# Patient Record
Sex: Female | Born: 1995 | Hispanic: Yes | Marital: Single | Smoking: Never smoker
Health system: Southern US, Community
[De-identification: ages and names within clinical notes are randomized; demographics above are authoritative.]

---
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
## 2014-03-29 NOTE — L&D Delivery Note (Cosign Needed)
    Delivery Note At 8:46 AM a viable and healthy female was delivered via Vaginal, Spontaneous Delivery in partial caul. (Presentation: Right Occiput Anterior, compound presentation with left hand by face).  APGAR:8 ,9 ; weight pending  .   Placenta status: Intact, Spontaneous.  Cord:  with the following complications: loose cord around body, easily reduced.  Cord pH: N/A  Anesthesia: None  Episiotomy: None Lacerations: None Suture Repair: none Est. Blood Loss (mL):  50  Mom to postpartum.  Baby to Couplet care / Skin to Skin.  De Hollingshead 10/23/2014, 9:09 AM   Patient is a G2P2001 at [redacted]w[redacted]d who was admitted active labor, significant hx of uncomplicated prenatal course.    I was gloved and present for delivery in its entirety.  Second stage of labor progressed, baby delivered easily through body cord without anesthesia. No complications  Lacerations: none  EBL: 50  Federico Flake, MD 8:13 AM

## 2014-05-02 ENCOUNTER — Other Ambulatory Visit (HOSPITAL_COMMUNITY): Payer: Self-pay | Admitting: Physician Assistant

## 2014-05-02 DIAGNOSIS — Z3689 Encounter for other specified antenatal screening: Secondary | ICD-10-CM

## 2014-05-02 LAB — OB RESULTS CONSOLE RPR: RPR: NONREACTIVE

## 2014-05-02 LAB — OB RESULTS CONSOLE ABO/RH: RH Type: POSITIVE

## 2014-05-02 LAB — OB RESULTS CONSOLE ANTIBODY SCREEN: Antibody Screen: NEGATIVE

## 2014-05-02 LAB — OB RESULTS CONSOLE HIV ANTIBODY (ROUTINE TESTING): HIV: NONREACTIVE

## 2014-05-02 LAB — OB RESULTS CONSOLE RUBELLA ANTIBODY, IGM: Rubella: IMMUNE

## 2014-05-02 LAB — OB RESULTS CONSOLE HEPATITIS B SURFACE ANTIGEN: Hepatitis B Surface Ag: NEGATIVE

## 2014-06-06 ENCOUNTER — Encounter (HOSPITAL_COMMUNITY): Payer: Self-pay

## 2014-06-06 ENCOUNTER — Ambulatory Visit (HOSPITAL_COMMUNITY)
Admission: RE | Admit: 2014-06-06 | Discharge: 2014-06-06 | Disposition: A | Discharge status: Home / Self Care | Payer: Self-pay | Source: Ambulatory Visit | Attending: Physician Assistant | Admitting: Physician Assistant

## 2014-06-06 DIAGNOSIS — Z36 Encounter for antenatal screening of mother: Secondary | ICD-10-CM | POA: Insufficient documentation

## 2014-06-06 DIAGNOSIS — Z3689 Encounter for other specified antenatal screening: Secondary | ICD-10-CM | POA: Insufficient documentation

## 2014-06-06 DIAGNOSIS — Z3A19 19 weeks gestation of pregnancy: Secondary | ICD-10-CM | POA: Insufficient documentation

## 2014-06-19 ENCOUNTER — Ambulatory Visit (HOSPITAL_COMMUNITY): Payer: Self-pay

## 2014-07-01 ENCOUNTER — Other Ambulatory Visit (HOSPITAL_COMMUNITY): Payer: Self-pay | Admitting: Nurse Practitioner

## 2014-07-01 DIAGNOSIS — Z3689 Encounter for other specified antenatal screening: Secondary | ICD-10-CM

## 2014-07-08 ENCOUNTER — Ambulatory Visit (HOSPITAL_COMMUNITY)
Admission: RE | Admit: 2014-07-08 | Discharge: 2014-07-08 | Disposition: A | Discharge status: Home / Self Care | Payer: Self-pay | Source: Ambulatory Visit | Attending: Nurse Practitioner | Admitting: Nurse Practitioner

## 2014-07-08 DIAGNOSIS — IMO0002 Reserved for concepts with insufficient information to code with codable children: Secondary | ICD-10-CM | POA: Insufficient documentation

## 2014-07-08 DIAGNOSIS — Z3A25 25 weeks gestation of pregnancy: Secondary | ICD-10-CM | POA: Insufficient documentation

## 2014-07-08 DIAGNOSIS — Z0489 Encounter for examination and observation for other specified reasons: Secondary | ICD-10-CM | POA: Insufficient documentation

## 2014-07-08 DIAGNOSIS — Z3A24 24 weeks gestation of pregnancy: Secondary | ICD-10-CM | POA: Insufficient documentation

## 2014-07-08 DIAGNOSIS — Z3689 Encounter for other specified antenatal screening: Secondary | ICD-10-CM

## 2014-07-08 DIAGNOSIS — Z36 Encounter for antenatal screening of mother: Secondary | ICD-10-CM | POA: Insufficient documentation

## 2014-10-01 LAB — OB RESULTS CONSOLE GC/CHLAMYDIA
Chlamydia: NEGATIVE
Gonorrhea: NEGATIVE

## 2014-10-01 LAB — OB RESULTS CONSOLE GBS: GBS: NEGATIVE

## 2014-10-23 ENCOUNTER — Encounter (HOSPITAL_COMMUNITY): Payer: Self-pay | Admitting: *Deleted

## 2014-10-23 ENCOUNTER — Inpatient Hospital Stay (HOSPITAL_COMMUNITY)
Admission: AD | Admit: 2014-10-23 | Discharge: 2014-10-24 | DRG: 775 | Disposition: A | Discharge status: Home / Self Care | Payer: Medicaid Other | Source: Ambulatory Visit | Attending: Obstetrics & Gynecology | Admitting: Obstetrics & Gynecology

## 2014-10-23 DIAGNOSIS — Z3403 Encounter for supervision of normal first pregnancy, third trimester: Secondary | ICD-10-CM | POA: Diagnosis present

## 2014-10-23 DIAGNOSIS — O326XX0 Maternal care for compound presentation, not applicable or unspecified: Secondary | ICD-10-CM | POA: Diagnosis not present

## 2014-10-23 DIAGNOSIS — Z3A40 40 weeks gestation of pregnancy: Secondary | ICD-10-CM | POA: Diagnosis not present

## 2014-10-23 DIAGNOSIS — O480 Post-term pregnancy: Secondary | ICD-10-CM | POA: Diagnosis not present

## 2014-10-23 DIAGNOSIS — IMO0001 Reserved for inherently not codable concepts without codable children: Secondary | ICD-10-CM

## 2014-10-23 LAB — CBC
HCT: 33.9 % — ABNORMAL LOW (ref 36.0–46.0)
Hemoglobin: 11.4 g/dL — ABNORMAL LOW (ref 12.0–15.0)
MCH: 28.9 pg (ref 26.0–34.0)
MCHC: 33.6 g/dL (ref 30.0–36.0)
MCV: 85.8 fL (ref 78.0–100.0)
Platelets: 274 10*3/uL (ref 150–400)
RBC: 3.95 MIL/uL (ref 3.87–5.11)
RDW: 14.6 % (ref 11.5–15.5)
WBC: 15 10*3/uL — ABNORMAL HIGH (ref 4.0–10.5)

## 2014-10-23 LAB — TYPE AND SCREEN
ABO/RH(D): O POS
Antibody Screen: NEGATIVE

## 2014-10-23 MED ORDER — TETANUS-DIPHTH-ACELL PERTUSSIS 5-2.5-18.5 LF-MCG/0.5 IM SUSP: 0.5000 mL | Freq: Once | INTRAMUSCULAR | Status: DC

## 2014-10-23 MED ORDER — ACETAMINOPHEN 325 MG PO TABS: 650.0000 mg | ORAL_TABLET | ORAL | Status: DC | PRN

## 2014-10-23 MED ORDER — ZOLPIDEM TARTRATE 5 MG PO TABS: 5.0000 mg | ORAL_TABLET | Freq: Every evening | ORAL | Status: DC | PRN

## 2014-10-23 MED ORDER — ONDANSETRON HCL 4 MG PO TABS: 4.0000 mg | ORAL_TABLET | ORAL | Status: DC | PRN

## 2014-10-23 MED ORDER — SENNOSIDES-DOCUSATE SODIUM 8.6-50 MG PO TABS
2.0000 | ORAL_TABLET | ORAL | Status: DC
  Administered 2014-10-23: 2 via ORAL
  Filled 2014-10-23: qty 2

## 2014-10-23 MED ORDER — PRENATAL MULTIVITAMIN CH
1.0000 | ORAL_TABLET | Freq: Every day | ORAL | Status: DC
  Administered 2014-10-24: 1 via ORAL
  Filled 2014-10-23: qty 1

## 2014-10-23 MED ORDER — CITRIC ACID-SODIUM CITRATE 334-500 MG/5ML PO SOLN: 30.0000 mL | ORAL | Status: DC | PRN

## 2014-10-23 MED ORDER — WITCH HAZEL-GLYCERIN EX PADS: 1.0000 "application " | MEDICATED_PAD | CUTANEOUS | Status: DC | PRN

## 2014-10-23 MED ORDER — ONDANSETRON HCL 4 MG/2ML IJ SOLN: 4.0000 mg | Freq: Four times a day (QID) | INTRAMUSCULAR | Status: DC | PRN

## 2014-10-23 MED ORDER — OXYTOCIN 40 UNITS IN LACTATED RINGERS INFUSION - SIMPLE MED: 62.5000 mL/h | INTRAVENOUS | Status: DC

## 2014-10-23 MED ORDER — BENZOCAINE-MENTHOL 20-0.5 % EX AERO
1.0000 | INHALATION_SPRAY | CUTANEOUS | Status: DC | PRN
Start: 2014-10-23 — End: 2014-10-24

## 2014-10-23 MED ORDER — FLEET ENEMA 7-19 GM/118ML RE ENEM: 1.0000 | ENEMA | RECTAL | Status: DC | PRN

## 2014-10-23 MED ORDER — OXYTOCIN BOLUS FROM INFUSION: 500.0000 mL | INTRAVENOUS | Status: DC

## 2014-10-23 MED ORDER — LANOLIN HYDROUS EX OINT
TOPICAL_OINTMENT | CUTANEOUS | Status: DC | PRN
Start: 2014-10-23 — End: 2014-10-24

## 2014-10-23 MED ORDER — ONDANSETRON HCL 4 MG/2ML IJ SOLN: 4.0000 mg | INTRAMUSCULAR | Status: DC | PRN

## 2014-10-23 MED ORDER — LACTATED RINGERS IV SOLN: INTRAVENOUS | Status: DC

## 2014-10-23 MED ORDER — DIPHENHYDRAMINE HCL 25 MG PO CAPS: 25.0000 mg | ORAL_CAPSULE | Freq: Four times a day (QID) | ORAL | Status: DC | PRN

## 2014-10-23 MED ORDER — DIBUCAINE 1 % RE OINT: 1.0000 "application " | TOPICAL_OINTMENT | RECTAL | Status: DC | PRN

## 2014-10-23 MED ORDER — OXYCODONE-ACETAMINOPHEN 5-325 MG PO TABS: 1.0000 | ORAL_TABLET | ORAL | Status: DC | PRN

## 2014-10-23 MED ORDER — OXYCODONE-ACETAMINOPHEN 5-325 MG PO TABS: 2.0000 | ORAL_TABLET | ORAL | Status: DC | PRN

## 2014-10-23 MED ORDER — LIDOCAINE HCL (PF) 1 % IJ SOLN
30.0000 mL | INTRAMUSCULAR | Status: DC | PRN
  Filled 2014-10-23: qty 30

## 2014-10-23 MED ORDER — LIDOCAINE HCL (PF) 1 % IJ SOLN
INTRAMUSCULAR | Status: AC
  Filled 2014-10-23: qty 30

## 2014-10-23 MED ORDER — LACTATED RINGERS IV SOLN: 500.0000 mL | INTRAVENOUS | Status: DC | PRN

## 2014-10-23 MED ORDER — OXYTOCIN 10 UNIT/ML IJ SOLN
INTRAMUSCULAR | Status: AC
  Administered 2014-10-23: 10 [IU]
  Filled 2014-10-23: qty 1

## 2014-10-23 MED ORDER — IBUPROFEN 600 MG PO TABS
600.0000 mg | ORAL_TABLET | Freq: Four times a day (QID) | ORAL | Status: DC
  Administered 2014-10-23 – 2014-10-24 (×4): 600 mg via ORAL
  Filled 2014-10-23 (×4): qty 1

## 2014-10-23 MED ORDER — SIMETHICONE 80 MG PO CHEW: 80.0000 mg | CHEWABLE_TABLET | ORAL | Status: DC | PRN

## 2014-10-23 NOTE — Progress Notes (Signed)
Dr Earlene Plater notified of pt's VE Complete

## 2014-10-23 NOTE — MAU Note (Signed)
Pt presents to MAU with complaints of contractions that started at 4 this morning. Reports bloody show with good fetal movement

## 2014-10-23 NOTE — Lactation Note (Signed)
This note was copied from the chart of Lindsay Betta Andreas-Malaga. Lactation Consultation Note Experienced BF mom of a 19 yr old for 1 1/2 yrs. And stopped d/t pregnancy. Mom has good everted nipples, baby was laying in her lap BF w/the body towards the foot of the bed, head of baby turned upwards towards mom pulling on the end of the nipple and baby's face isn't touching mom. Interpreter present, discussed positioning and body alignment of BF for mom and baby. Repositioned baby for mom. Mom denied painful latching. Referred to Baby and Me Book in Breastfeeding section Pg. 22-23 for position options and Proper latch demonstration. Mom encouraged to feed baby 8-12 times/24 hours and with feeding cues. Mom encouraged to do skin-to-skin. Educated about newborn behavior, I&O, cluster feeding, supplementing w/formula. Saw formula in room. Discussed supplementing how it can decrease milk supply and over fill the baby.  [facility] brochure given w/resources, support groups and LC services.  Patient Name: Lindsay Contreras [id number] Date: 10/23/2014 Reason for consult: Initial assessment   Maternal Data Does the patient have breastfeeding experience prior to this delivery?: Yes  Feeding Feeding Type: Breast Fed Nipple Type: Slow - flow Length of feed: 20 min  LATCH Score/Interventions Latch: Grasps breast easily, tongue down, lips flanged, rhythmical sucking.  Audible Swallowing: A few with stimulation Intervention(s): Skin to skin;Hand expression;Alternate breast massage  Type of Nipple: Everted at rest and after stimulation  Comfort (Breast/Nipple): Soft / non-tender     Hold (Positioning): Assistance needed to correctly position infant at breast and maintain latch. Intervention(s): Skin to skin;Position options;Support Pillows;Breastfeeding basics reviewed  LATCH Score: 8  Lactation Tools Discussed/Used     Consult Status Consult Status: PRN Follow-up type: In-patient    Charyl Dancer 10/23/2014, 11:23 PM

## 2014-10-23 NOTE — H&P (Signed)
Lindsay Contreras is a 19 y.o. female presenting for active labor.   Maternal Medical History:  Reason for admission: Contractions.   Contractions: Onset was 6-12 hours ago.   Frequency: regular.   Duration is approximately 60 seconds.   Perceived severity is strong.    Fetal activity: Perceived fetal activity is normal.   Last perceived fetal movement was within the past hour.      OB History    Gravida Para Term Preterm AB TAB SAB Ectopic Multiple Living   1              History reviewed. No pertinent past medical history. History reviewed. No pertinent past surgical history. Family History: family history is not on file. Social History:  has no tobacco, alcohol, and drug history on file.   Prenatal Transfer Tool  Maternal Diabetes: No Genetic Screening: Declined Maternal Ultrasounds/Referrals: Normal Fetal Ultrasounds or other Referrals:  None Maternal Substance Abuse:  Yes:  Type: Other: ETOH per [facility] records Significant Maternal Medications:  None Significant Maternal Lab Results:  None Other Comments:  None  ROS  Dilation: 10 Station: -2 Exam by:: Ginger Morris RN Blood pressure 109/64, pulse 70, resp. rate 18, height  (1.6 m), weight 177 lb (80.287 kg). Exam Physical Exam  Prenatal labs: ABO, Rh: O/Positive/-- (02/04 0000) Antibody: Negative (02/04 0000) Rubella: Immune (02/04 0000) RPR: Nonreactive (02/04 0000)  HBsAg: Negative (02/04 0000)  HIV: Non-reactive (02/04 0000)  GBS: Negative (07/05 0000)   Assessment/Plan: Active labor  Admit to LD for eminent delivery as the patient was c/c/+1 in MAU GBS negative Plans to breastfeed Need to ask about contraception as patient was actively delivering.   Isa Rankin [facility] 10/23/2014, 10:28 AM

## 2014-10-24 LAB — CBC
HCT: 29.3 % — ABNORMAL LOW (ref 36.0–46.0)
Hemoglobin: 9.7 g/dL — ABNORMAL LOW (ref 12.0–15.0)
MCH: 28.8 pg (ref 26.0–34.0)
MCHC: 33.1 g/dL (ref 30.0–36.0)
MCV: 86.9 fL (ref 78.0–100.0)
Platelets: 252 10*3/uL (ref 150–400)
RBC: 3.37 MIL/uL — ABNORMAL LOW (ref 3.87–5.11)
RDW: 14.9 % (ref 11.5–15.5)
WBC: 11.4 10*3/uL — ABNORMAL HIGH (ref 4.0–10.5)

## 2014-10-24 LAB — RPR: RPR Ser Ql: NONREACTIVE

## 2014-10-24 LAB — ABO/RH: ABO/RH(D): O POS

## 2014-10-24 MED ORDER — IBUPROFEN 600 MG PO TABS: 600.0000 mg | ORAL_TABLET | Freq: Four times a day (QID) | ORAL | Status: DC | PRN

## 2014-10-24 NOTE — Discharge Instructions (Signed)

## 2014-10-24 NOTE — Lactation Note (Signed)
This note was copied from the chart of Lindsay Contreras. Lactation Consultation Note  Copy Used [phone] for Spanish. P2, Ex BF 1.5 years.  Mother was able to hand express drops of colostrum. Mother latched baby briefly in laid back position.  Baby latches easily. Encouraged depth. Mother states baby often falls asleep during feedings. Suggest undressing baby to diaper for feedings.  Baby recently breastfed for 1/2 hour. Explained he may be sleepy and not hungry at this time.   Mom encouraged to feed baby 8-12 times/24 hours and with feeding cues.  Discussed engorgement care and monitoring voids/stools.  Patient Name: Lindsay Avrianna Smart [id number] Date: 10/24/2014 Reason for consult: Follow-up assessment   Maternal Data    Feeding Feeding Type: Breast Fed Length of feed: 5 min  LATCH Score/Interventions Latch: Grasps breast easily, tongue down, lips flanged, rhythmical sucking.  Audible Swallowing: A few with stimulation Intervention(s): Hand expression  Type of Nipple: Everted at rest and after stimulation  Comfort (Breast/Nipple): Soft / non-tender     Hold (Positioning): Assistance needed to correctly position infant at breast and maintain latch. Intervention(s): Support Pillows;Breastfeeding basics reviewed  LATCH Score: 8  Lactation Tools Discussed/Used     Consult Status Consult Status: Complete    Hardie Pulley 10/24/2014, 11:07 AM

## 2014-10-24 NOTE — Discharge Summary (Signed)
Obstetric Discharge Summary Reason for Admission: onset of labor Prenatal Procedures: none Intrapartum Procedures: spontaneous vaginal delivery Postpartum Procedures: none Complications-Operative and Postpartum: none  Delivery Note At 8:46 AM a viable and healthy female was delivered via Vaginal, Spontaneous Delivery in partial caul. (Presentation: Right Occiput Anterior, compound presentation with left hand by face). APGAR:8 ,9 ; weight pending .  Placenta status: Intact, Spontaneous. Cord: with the following complications: loose cord around body, easily reduced. Cord pH: N/A  Anesthesia: None  Episiotomy: None Lacerations: None Suture Repair: none Est. Blood Loss (mL): 50  Mom to postpartum. Baby to Couplet care / Skin to Skin.  Hospital Course:  Active Problems:   Active labor at term   Active labor   Lindsay Contreras is a 19 y.o. G2P2001 s/p SVD.  Patient was admitted 7/27.  She has postpartum course that was uncomplicated including no problems with ambulating, PO intake, urination, pain, or bleeding. The pt feels ready to go home and  will be discharged with outpatient follow-up.   Today: No acute events overnight.  Pt denies problems with ambulating, voiding or po intake.  She denies nausea or vomiting.  Pain is well controlled.  She has had flatus. She has not had bowel movement.  Lochia Minimal.  Plan for birth control is  Depo-Provera.  Method of Feeding: Breast  Physical Exam:  General: alert, cooperative and appears stated age 19: appropriate Uterine Fundus: firm DVT Evaluation: No evidence of DVT seen on physical exam.  H/H: Lab Results  Component Value Date/Time   HGB 9.7* 10/24/2014 05:30 AM   HCT 29.3* 10/24/2014 05:30 AM    Discharge Diagnoses: Term Pregnancy-delivered  Discharge Information: Date: 10/24/2014 Activity: pelvic rest Diet: routine  Medications: PNV and Ibuprofen Breast feeding:  Yes Condition: stable Instructions: refer  to handout Discharge to: home      Medication List    TAKE these medications        ibuprofen 600 MG tablet  Commonly known as:  ADVIL,MOTRIN  Take 1 tablet (600 mg total) by mouth every 6 (six) hours as needed.     prenatal multivitamin Tabs tablet  Take 1 tablet by mouth daily at 12 noon.           Follow-up Information    Follow up with [facility] [place]. Schedule an appointment as soon as possible for a visit in 4 weeks.   Why:  For your postpartum appointment.   Contact information:   [address] [place] [address] [phone]      Lowanda Foster ,MD 10/24/2014,9:19 AM   CNM attestation I have seen and examined this patient and agree with above documentation in the resident's note.   Lindsay Contreras is a 19 y.o. G2P2001 s/p SVD.   Pain is well controlled.  Plan for birth control is Depo-Provera.  Method of Feeding: breast  PE:  BP 109/65 mmHg  Pulse 56  Temp(Src) 98 F (36.7 C) (Oral)  Resp 16  Ht  (1.6 m)  Wt 80.287 kg (177 lb)  BMI 31.36 kg/m2  SpO2 100%  Breastfeeding? Unknown Fundus firm   Recent Labs  10/23/14 1406 10/24/14 0530  HGB 11.4* 9.7*  HCT 33.9* 29.3*     Plan: discharge today - postpartum care discussed - f/u clinic in 6 weeks for postpartum visit   Cam Hai, CNM 9:23 AM 10/24/2014

## 2015-09-10 IMAGING — US US OB FOLLOW-UP
1 series · 13 of 28 positions shown · non-contrast
Comparison: none

[Series 1: us ob follow-up · 0.11mm/px · 69 acquisitions, 13 frames shown]
[im 3/69]
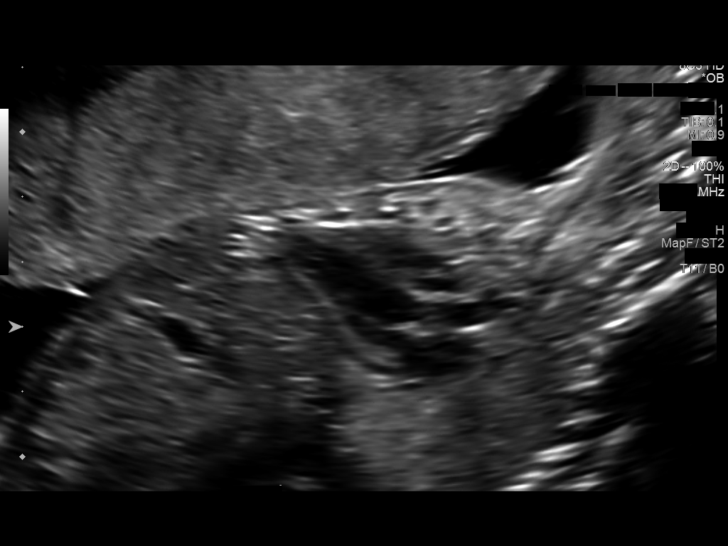
[im 8/69]
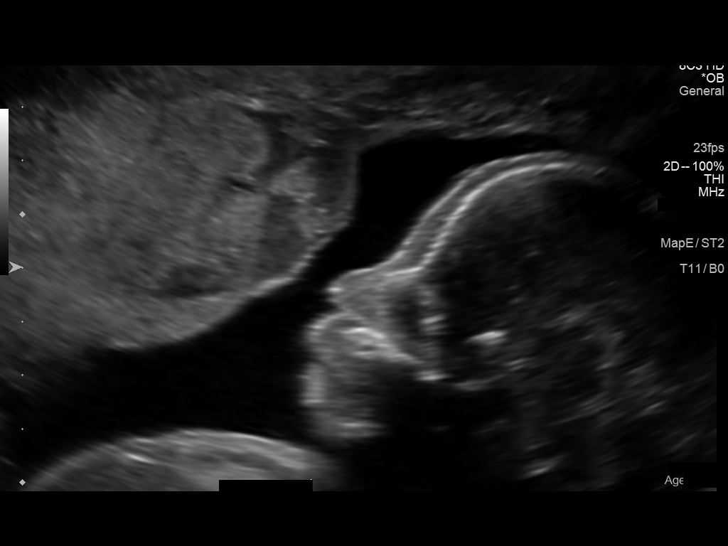
[im 13/69]
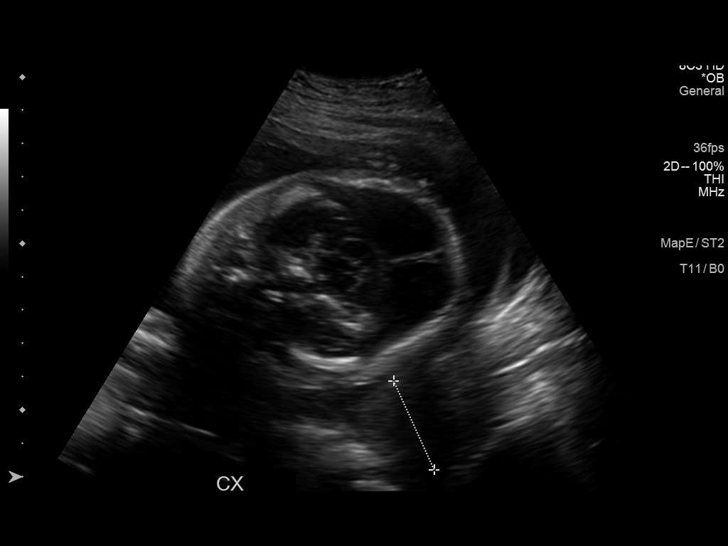
[im 18/69]
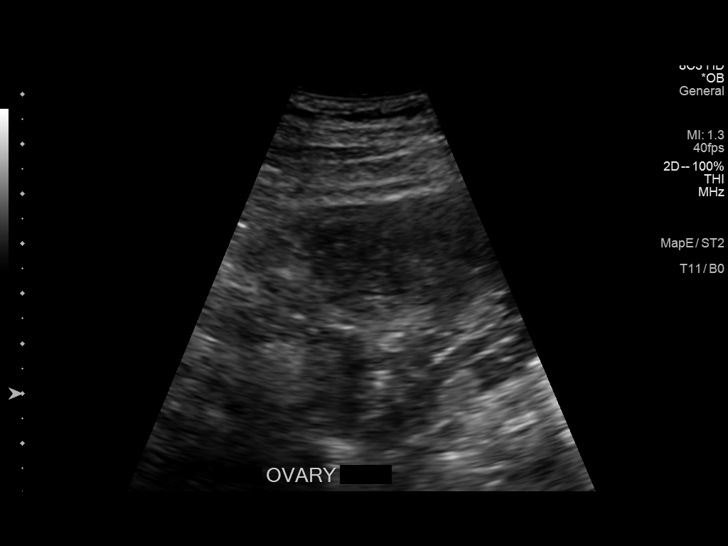
[im 23/69]
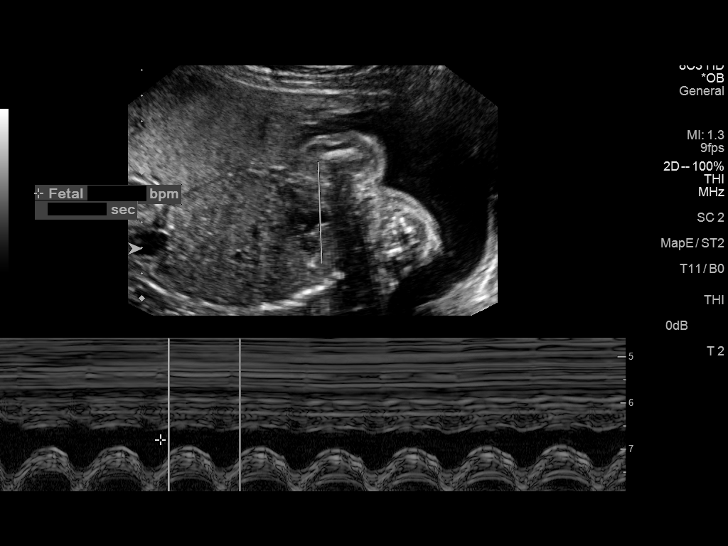
[im 28/69]
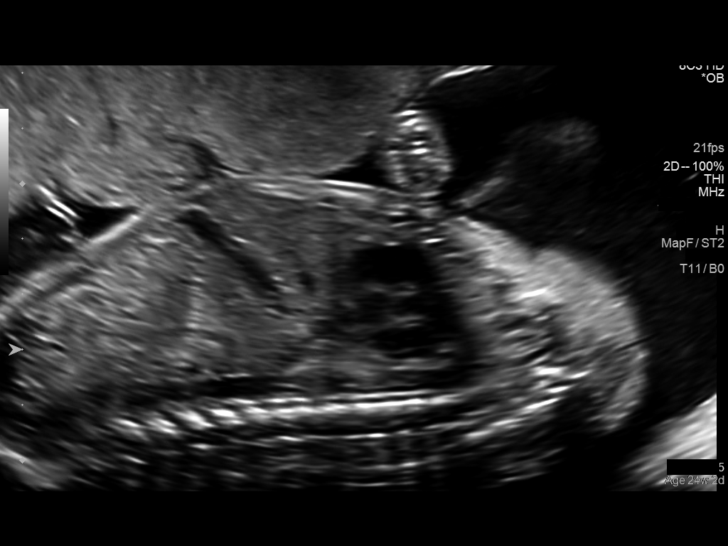
[im 36/69]
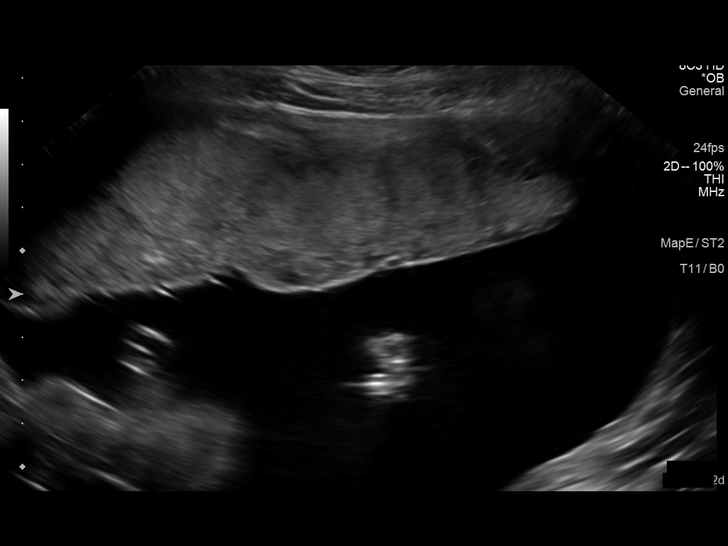
[im 41/69]
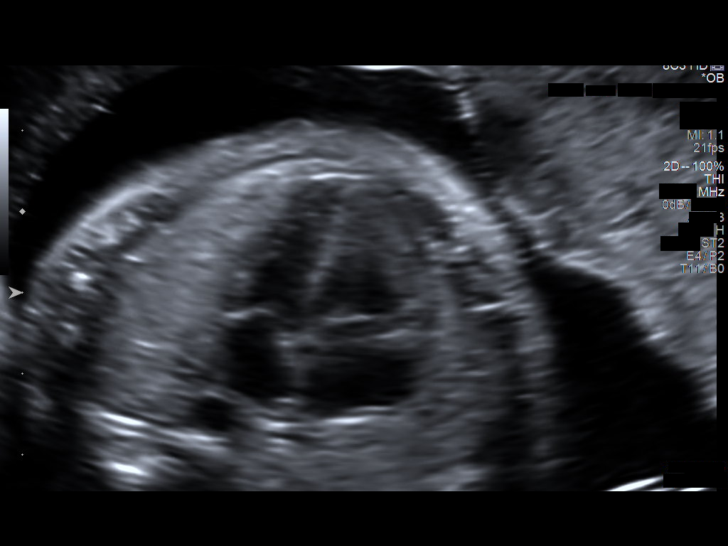
[im 46/69]
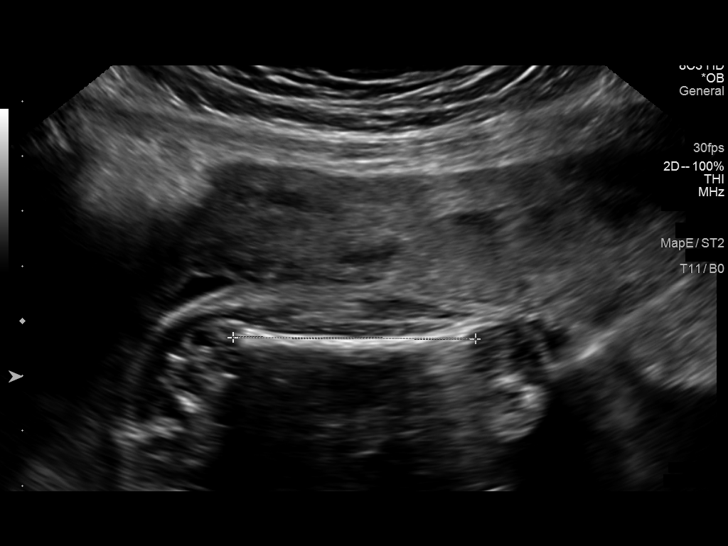
[im 51/69]
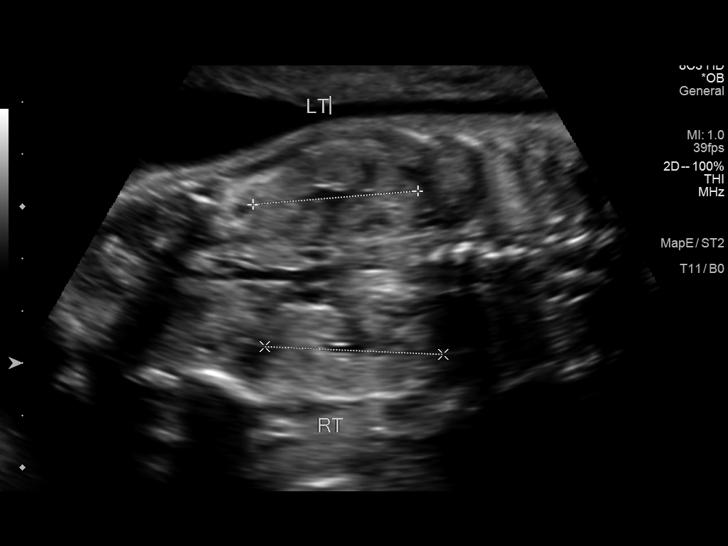
[im 56/69]
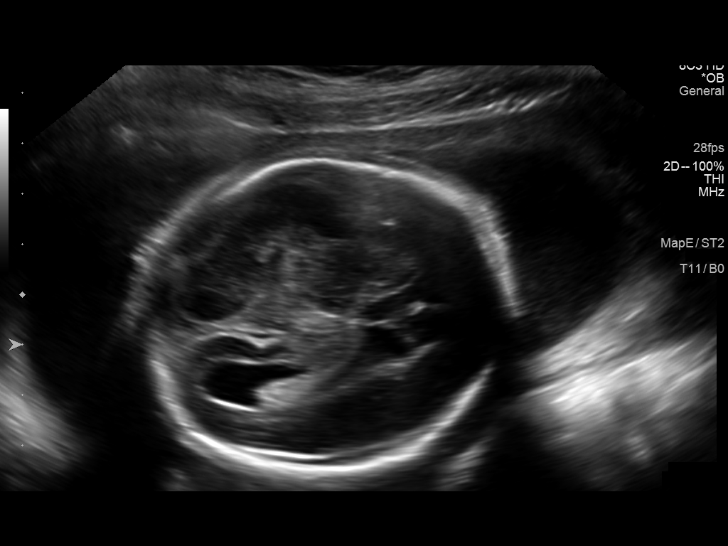
[im 61/69]
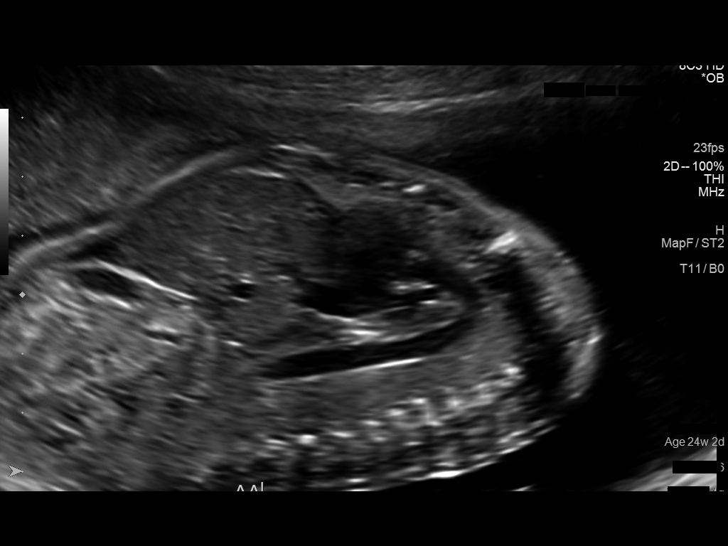
[im 66/69]
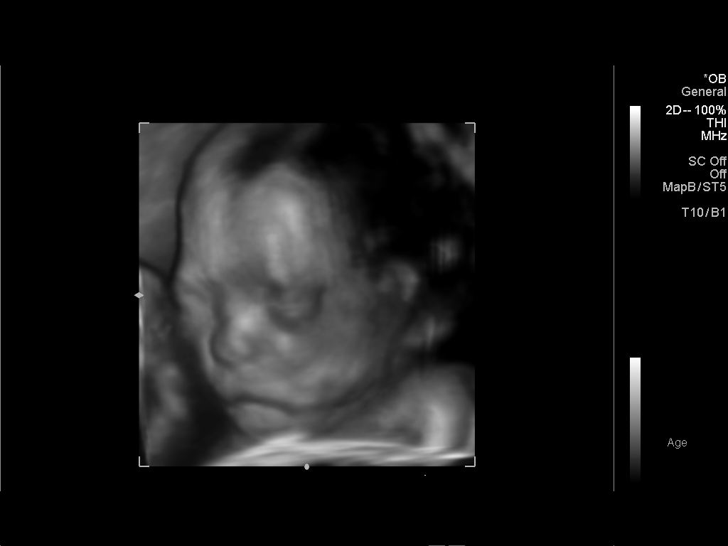

[13 of 28 positions shown; findings below may reference images not displayed]

OBSTETRICS REPORT
(Signed Final 07/08/2014 [DATE])

Service(s) Provided

US OB FOLLOW UP                                       76816.1
Indications

Follow-up incomplete fetal anatomic evaluation        Z36
Fetal Evaluation

Num Of Fetuses:    1
Fetal Heart Rate:  144                          bpm
Cardiac Activity:  Observed
Presentation:      Cephalic
Placenta:          Anterior, above cervical os
P. Cord            Previously Visualized
Insertion:

Amniotic Fluid
AFI FV:      Subjectively within normal limits
Larg Pckt:     6.0  cm
Biometry

BPD:     62.2  mm     G. Age:  25w 2d                CI:        79.56   70 - 86
FL/HC:      19.9   18.7 -
20.9
HC:     220.4  mm     G. Age:  24w 0d       24  %    HC/AC:      1.05   1.05 -
1.21
AC:     210.8  mm     G. Age:  25w 4d       80  %    FL/BPD:     70.4   71 - 87
FL:      43.8  mm     G. Age:  24w 3d       41  %    FL/AC:      20.8   20 - 24

Est. FW:     759  gm    1 lb 11 oz      66  %
Gestational Age

Clinical EDD:  24w 2d                                        EDD:   10/26/14
U/S Today:     24w 6d                                        EDD:   10/22/14
Best:          24w 2d     Det. By:  Clinical EDD             EDD:   10/26/14
Anatomy

Cranium:          Appears normal         Aortic Arch:      Appears normal
Fetal Cavum:      Appears normal         Ductal Arch:      Appears normal
Ventricles:       Appears normal         Diaphragm:        Appears normal
Choroid Plexus:   Previously seen        Stomach:          Appears normal, left
sided
Cerebellum:       Appears normal         Abdomen:          Appears normal
Posterior Fossa:  Previously seen        Abdominal Wall:   Previously seen
Nuchal Fold:      Previously seen        Cord Vessels:     Previously seen
Face:             Appears normal         Kidneys:          Appear normal
(orbits and profile)
Lips:             Appears normal         Bladder:          Appears normal
Heart:            Appears normal         Spine:            Previously seen
(4CH, axis, and
situs)
RVOT:             Appears normal         Lower             Previously seen
Extremities:
LVOT:             Appears normal         Upper             Previously seen
Extremities:

Other:  Technically difficult due to fetal position. Nasal bone visualized. Heels
previously visualized. Male gender previously visualized..
Cervix Uterus Adnexa

Cervical Length:    2.9      cm

Cervix:       Normal appearance by transabdominal scan.
Left Ovary:    Within normal limits.
Right Ovary:   Not visualized.

Adnexa:     No abnormality visualized.
Impression

Single IUP at 24w 2d
Normal interval anatomy; the anatomic survey is now
complete
Fetal growth is appropriate (66th %tile)
Normal amniotic fluid volume
Recommendations

Follow-up ultrasounds as clinically indicated.

## 2019-05-08 ENCOUNTER — Other Ambulatory Visit: Payer: Self-pay

## 2019-05-09 ENCOUNTER — Ambulatory Visit (INDEPENDENT_AMBULATORY_CARE_PROVIDER_SITE_OTHER): Payer: Self-pay | Admitting: Obstetrics and Gynecology

## 2019-05-09 ENCOUNTER — Encounter: Payer: Self-pay | Admitting: Obstetrics and Gynecology

## 2019-05-09 VITALS — BP 118/76 | Ht 64.0 in | Wt 147.0 lb

## 2019-05-09 DIAGNOSIS — R102 Pelvic and perineal pain: Secondary | ICD-10-CM

## 2019-05-09 DIAGNOSIS — Z8742 Personal history of other diseases of the female genital tract: Secondary | ICD-10-CM

## 2019-05-09 DIAGNOSIS — N939 Abnormal uterine and vaginal bleeding, unspecified: Secondary | ICD-10-CM

## 2019-05-09 DIAGNOSIS — R87612 Low grade squamous intraepithelial lesion on cytologic smear of cervix (LGSIL): Secondary | ICD-10-CM

## 2019-05-09 MED ORDER — NORGESTIMATE-ETH ESTRADIOL 0.25-35 MG-MCG PO TABS: 1.0000 | ORAL_TABLET | Freq: Every day | ORAL | 4 refills | Status: DC

## 2019-05-09 NOTE — Patient Instructions (Addendum)
Please start the birth control pills that were prescribed to you.  I would recommend taking a break from Depo-Provera injections for the next few years at least. If you are continuing to have irregular bleeding and periods, please let us know and we may want to get a pelvic ultrasound. We will let you know about the result from the cervix biopsy today.  Virus del papiloma humano Human Papillomavirus El virus del papiloma humano (VPH) es la infeccin de transmisin sexual (ITS) ms frecuente. Se transmite fcilmente de persona a persona (esmuy contagioso). Hay varios tipos de VPH. Generalmente, no presenta sntomas. Sin embargo, a Physiological scientist similares a las Chief Financial Officer garganta o verrugas en la zona genital. Las personas pueden estar infectadas con el VPH por un Harle Battiest y transmitirlo a Economist sin saberlo. Ciertos tipos de Tax inspector, incluido el cncer de la porcin baja del tero (cuello uterino), vagina, zona genital externa femenina (vulva), pene, ano y recto. El VPH tambin puede causar cncer de la cavidad bucal, como garganta, lengua y [place]. Cules son las causas? El VPH es causado por un virus que se propaga de Neomia Dear persona a otra a travs del contacto sexual oral, vaginal o anal. Qu incrementa el riesgo? Es ms probable que se desarrolle esta afeccin si usted tiene o ha tenido lo siguiente:  Sexo oral, vaginal o anal sin proteccin.  Varias parejas sexuales.  Neomia Dear pareja sexual que tiene Teaching laboratory technician.  Otras ITS.  Un sistema que combate las enfermedades (sistema inmunitario) debilitado.  Piel daada en la zona genital, oral o anal. Cules son los signos o los sntomas? La [employer] de las personas que tienen el VPH no presentan ningn sntoma. Si se presentan sntomas, estos pueden incluir lo siguiente:  Lesiones similares a verrugas en la garganta (por practicar sexo oral).  Verrugas en la piel infectada o en las  membranas mucosas.  Verrugas genitales que Engineer, manufacturing, arder, Geophysicist/field seismologist o doler durante las [employer]. Cmo se diagnostica? Si tiene bultos tipo verrugas en la zona anal o en la garganta, o si hay verrugas genitales presentes, el mdico generalmente puede diagnosticar VPH con un examen fsico. Las verrugas genitales se ven fcilmente. Las [employer] se pueden [employer] siguientes estudios, entre otros, para diagnosticar el VPH:  Prueba de Papanicolaou. En la prueba de Papanicolaou se toma una muestra de clulas del cuello uterino para Engineer, site e infeccin por VPH.  Prueba de VPH. Es similar a la prueba de Papanicolaou y [place] en tomar Lauris Poag de clulas del cuello uterino.  Uso de un colposcopio para ver el cuello uterino (colposcopa). Este estudio se puede realizar si el examen plvico o la prueba de Papanicolaou arrojan resultados anormales. Durante la colposcopa se extrae Lauris Poag de tejido (biopsia) para estudiarla. Actualmente, no existe un estudio para Administrator, Civil Service en los hombres. Cmo se trata? No existe un tratamiento para el propio virus. Sin embargo, hay tratamientos para los problemas de salud y los sntomas que puede Engineer, structural. El tratamiento para el VPH puede incluir lo siguiente:  Medicamentos en forma de crema, locin, lquido o gel. Estos medicamentos pueden inyectarse o aplicarse en las verrugas genitales o anales.  Uso de una sonda para aplicar fro extremo (crioterapia) en las verrugas genitales o anales.  Aplicacin de un haz de luz intenso (tratamiento con lser) en las verrugas genitales o anales.  Uso de una sonda para aplicar calor extremo (electrocauterizacin) en las  verrugas genitales o anales.  Ciruga para extirpar las verrugas genitales o anales. El mdico lo controlar detenidamente despus del tratamiento. El VPH puede reaparecer y es posible que requiera tratamiento nuevamente. Siga estas instrucciones en su  casa: Medicamentos  [employer] de venta libre y los recetados solamente como se lo haya indicado el mdico. Esto incluye cremas para la comezn o la irritacin.  No trate las verrugas genitales o anales con los medicamentos utilizados para el tratamiento de las verrugas de las manos. Instrucciones generales  No toque ni rasque las verrugas.  No mantenga relaciones sexuales durante el tratamiento.  No utilice tampones ni duchas vaginales durante el tratamiento (en el caso de las mujeres).  Hable con sus pareja sexual sobre su infeccin. Es posible que su pareja tambin necesite [place].  Si queda embarazada, informe a su mdico que [employer]. El mdico la controlar rigurosamente durante el embarazo para asegurarse de que usted y el beb estn seguros.  Concurra a todas las visitas de [address] se lo haya indicado el mdico. Esto es importante. Cmo se evita?  Hable con el mdico sobre la posibilidad de aplicarse una vacuna contra el VPH, que puede prevenir algunas infecciones por VPH y cnceres relacionados. Esta no ser efectiva si usted ya tiene el VPH y no se recomienda a las Probation officer. Es posible que necesite 2 o 3 dosis de la vacuna, segn su edad.  Despus del tratamiento, use preservativos durante las relaciones sexuales para prevenir futuras infecciones.  Tenga solo una pareja sexual.  No tenga Neomia Dear pareja sexual que tenga otras parejas sexuales.  Hgase prueba de Papanicolaou regularmente como se lo haya indicado el mdico. Comunquese con un mdico si:  La piel tratada se enrojece, se hincha o duele.  Tiene fiebre.  Siente un Engineer, maintenance (IT).  Palpa bultos o granos en la zona genital o anal, o en su alrededor.  Presenta sangrado vaginal o en la zona de tratamiento.  Tiene dolor al [employer]. Resumen  El virus del papiloma humano (VPH) es la infeccin de transmisin sexual (ITS) ms frecuente y es  altamente contagiosa.  La mayora de las personas que tienen VPH no presentan ningn sntoma.  Muchas formas del VPH se pueden prevenir con vacunas.  No existe un tratamiento para el propio virus. Sin embargo, hay tratamientos para los problemas de salud y los sntomas que puede Engineer, structural. Esta informacin no tiene Theme park manager el consejo del mdico. Asegrese de hacerle al mdico cualquier pregunta que tenga. Document Revised: 12/16/2017 Document Reviewed: 12/16/2017 Elsevier Patient Education  2020 Elsevier Inc.  [place] uterino anormal Abnormal Uterine Bleeding El sangrado uterino anormal sucede cuando tiene un sangrado anormal del tero. Esto incluye lo siguiente:  Prdidas de [place] o [employer] perodos [place].  Sangrado luego de [employer].  Sangrado ms abundante que lo normal.  Perodos que duran ms de lo normal.  Sangrado luego de la menopausia. El sangrado uterino anormal puede Audiological scientist a las mujeres que estn en diversas etapas de la vida, desde adolescentes, mujeres frtiles y Probation officer, hasta mujeres que han llegado a la [place]. Las causas ms comunes de sangrado uterino anormal incluyen lo siguiente:  Psychiatrist.  Crecimiento de tejido (plipos).  Tumores no cancerosos (fibromas) en el tero.  Infeccin.  Cncer.  Desequilibrios hormonales. El mdico debe evaluar cualquier clase de sangrado anormal. Muchos casos son leves y simples de tratar, mientras que otros son ms graves. [place]  depender de la causa del sangrado. Siga estas indicaciones en su casa:  Controle su afeccin para ver si hay cambios.  No se haga duchas vaginales, no utilice tampones ni tenga relaciones sexuales si as se lo indic su mdico.  Cambie los apsitos con frecuencia.  Realcese los exmenes de rutina que incluyen exmenes plvicos y controles de cncer de cuello uterino.  Concurra a todas las visitas de  control como se lo haya indicado el mdico. Esto es importante. Comunquese con un mdico si:  El sangrado dura ms de una semana.  Se siente mareada por momentos.  Siente nuseas o vomita. Solicite ayuda de inmediato si:  Se desmaya.  Sangrado abundante que implica cambiar el apsito cada hora.  Siente dolor abdominal.  Jaclynn Guarneri.  Se siente dbil o presenta sudoracin.  Elimina cogulos de sangre grandes por la vagina. Resumen  El sangrado uterino anormal sucede cuando tiene un sangrado anormal del tero.  El mdico debe evaluar cualquier clase de sangrado anormal. Muchos casos son leves y simples de tratar, mientras que otros son ms graves.  El tratamiento depender de la causa del sangrado. Esta informacin no tiene Marine scientist el consejo del mdico. Asegrese de hacerle al mdico cualquier pregunta que tenga. Document Revised: 10/04/2016 Document Reviewed: 10/04/2016 Elsevier Patient Education  [place].

## 2019-05-09 NOTE — Progress Notes (Signed)
Lindsay Contreras  05-07-1995 [id number]  HPI The patient is a 24 y.o. [id number] who presents today for an abnormal Pap smear and irregular and prolonged menses on Depo-Provera.  On 03/02/2019 her Pap smear indicated LGSIL and HPV testing was not able to be performed.  She says she has never had an abnormal Pap smear before.  She has been on Depo-Provera for the last 4 years, with her last injection being 03/02/2019 and she says her periods have been irregular, sometimes lasting up to 15 days.  The irregularity and amount of menses has become worse in the last several months.  She uses 3 pads per day and sometimes also 3 pads at night or at least a few days of her period.  She says that when she was in [place] she was told she had many cysts on her ovaries and she took a "treatment for it" which included pills (probably birth control pills).  She denies any pelvic or abdominal pain.  She has noticed postcoital spotting in the last several months.  She says she has a longstanding history of irregular and sometimes heavy menstrual periods.  Things got a little worse for her since starting the Depo-Provera.  2 prior vaginal deliveries.  Not currently nursing.  Spanish interpreter is present for today's visit.  Past medical history,surgical history, problem list, medications, allergies, family history and social history were all reviewed and documented as reviewed in the EPIC chart.  ROS:  Feeling well. No dyspnea or chest pain on exertion.  No abdominal pain, change in bowel habits, black or bloody stools.  No urinary tract symptoms. GYN ROS: as reviewed in HPI  Physical Exam  BP 118/76   Ht 5\' 4"  (1.626 m)   Wt 147 lb (66.7 kg)   LMP 02/11/2019   BMI 25.23 kg/m   General: Pleasant female, no acute distress, alert and oriented PELVIC EXAM: VULVA: normal appearing vulva with no masses, tenderness or lesions, VAGINA: normal appearing vagina with normal color and discharge, no lesions, CERVIX: normal  appearing cervix without discharge or lesions, UTERUS: uterus is normal size, shape, consistency and nontender, ADNEXA: normal adnexa in size, nontender and no masses  Colposcopy procedure note The cervix was visualized with the speculum and dilute acetic acid was applied to the cervix and vaginal sidewalls.  Lugol's solution was applied over the cervix.  There is noted to be acetowhite change at 7 to 8 o'clock just outside of the external cervical os, where a biopsy is taken.  No other changes were noted on the cervix or vaginal walls.  Transformation zone was seen in its entirety.  Today's exam was adequate.  Visual impression consistent with possible mild dysplasia.  Physical Exam Genitourinary:        02/13/2019 present for the exam and procedure   Assessment 24 yo G2P2 here for a colposcopy exam due to LGSIL Pap smear and abnormal uterine bleeding on Depo-Provera  Plan 1.  Today's colposcopy exam was adequate.  We spent a lot of time today discussing HPV and its effects causing changes the cervix including precancer or cancerous changes, the chronic nature of HPV infection, role of colposcopy in diagnosing potential lesions, and answered her questions regarding these topics.  We will follow up the results of the pathology and further management will be dictated based upon these results.  2.  Abnormal uterine bleeding.  We discussed that since she has been on Depo-Provera for about 4 years now, it is common from  atrophy due to the hormonal imbalance with continued progestin only contraceptive use leading to irregular spotting and bleeding.  I recommended that she take a break from Depo-Provera for now, as continued long term use can also reversibly demineralize bones, so taking a break to help bones remineralize would be good at this point especially since she is having unwanted side effect of AUB.  We briefly reviewed other contraceptive options and she wanted to go back on regular birth  control pills.  A prescription for Ortho-Cyclen is provided.  Since she is not having any pelvic pain at this time, I do not think a pelvic ultrasound is necessary.  She indicates that she had a history of what sounds to be PCOS, but the birth control pills will also help target this condition and regulate any symptoms.  If her irregular menses continues, she is invited to return to have further evaluation.  All of her questions were answered to her satisfaction by the end of today's visit.  Joseph Pierini MD 05/09/19

## 2019-05-11 LAB — TISSUE SPECIMEN

## 2019-05-11 LAB — PATHOLOGY REPORT

## 2019-05-14 NOTE — Progress Notes (Signed)
Please let Dori know the cervix biopsy was abnormal. I would like her to come back to repeat the colposcopy in 6 months.

## 2019-06-25 ENCOUNTER — Telehealth: Payer: Self-pay | Admitting: *Deleted

## 2019-06-25 MED ORDER — NORETHINDRONE ACET-ETHINYL EST 1-20 MG-MCG PO TABS: 1.0000 | ORAL_TABLET | Freq: Every day | ORAL | 4 refills | Status: DC

## 2019-06-25 NOTE — Telephone Encounter (Signed)
She could try Lo-estrin 1/20 or equivalent instead

## 2019-06-25 NOTE — Telephone Encounter (Signed)
Rx sent for Loestrin 1/20 mcg to [facility]. Will route to [place] to relay

## 2019-06-25 NOTE — Telephone Encounter (Signed)
Patient informed. 

## 2019-06-25 NOTE — Telephone Encounter (Signed)
Patient called and spoke with Debarah Crape in spanish, was prescribed birth control pills ortho-cyclen, reports the pills make her feel bad and very nauseated, so patient stopped pills last Thursday. She asked if any other recommendations?

## 2019-07-19 ENCOUNTER — Other Ambulatory Visit: Payer: Self-pay

## 2019-07-19 ENCOUNTER — Encounter: Payer: Self-pay | Admitting: Nurse Practitioner

## 2019-07-19 ENCOUNTER — Ambulatory Visit (INDEPENDENT_AMBULATORY_CARE_PROVIDER_SITE_OTHER): Payer: Self-pay | Admitting: Nurse Practitioner

## 2019-07-19 VITALS — BP 118/74

## 2019-07-19 DIAGNOSIS — Z3009 Encounter for other general counseling and advice on contraception: Secondary | ICD-10-CM

## 2019-07-19 NOTE — Patient Instructions (Signed)
Schedule IUD insertion for 1 week after menses  Intrauterine Device Insertion An intrauterine device (IUD) is a medical device that gets inserted into the uterus to prevent pregnancy. It is a small, T-shaped device that has one or two nylon strings hanging down from it. The strings hang out of the lower part of the uterus (cervix) to allow for future IUD removal. There are two types of IUDs available:  Copper IUD. This type of IUD has copper wire wrapped around it. Copper makes the uterus and fallopian tubes produce a fluid that kills sperm. A copper IUD may last up to 10 years.  Hormone IUD. This type of IUD is made of plastic and contains the hormone progestin (synthetic progesterone). The hormone thickens mucus in the cervix and prevents sperm from entering the uterus. It also thins the uterine lining to prevent implantation of a fertilized egg. The hormone can weaken or kill the sperm that get into the uterus. A hormone IUD may last 3-5 years. Tell a health care provider about:  Any allergies you have.  All medicines you are taking, including vitamins, herbs, eye drops, creams, and over-the-counter medicines.  Any problems you or family members have had with anesthetic medicines.  Any blood disorders you have.  Any surgeries you have had.  Any medical conditions you have, including any STIs (sexually transmitted infections) you may have.  Whether you are pregnant or may be pregnant. What are the risks? Generally, this is a safe procedure. However, problems may occur, including:  Infection.  Bleeding.  Allergic reactions to medicines.  Accidental puncture (perforation) of the uterus, or damage to other structures or organs.  Accidental placement of the IUD either in the muscle layer of the uterus (myometrium) or outside the uterus.  The IUD falling out of the uterus (expulsion). This is more common among women who have recently had a child.  Pregnancy that happens in the  fallopian tube (ectopic pregnancy).  Infection of the uterus and fallopian tubes (pelvic inflammatory disease). What happens before the procedure?  Schedule the IUD insertion for when you will have your menstrual period or right after, to make sure you are not pregnant. Placement of the IUD is better tolerated shortly after a menstrual cycle.  Follow instructions from your health care provider about eating or drinking restrictions.  Ask your health care provider about changing or stopping your regular medicines. This is especially important if you are taking diabetes medicines or blood thinners.  You may get a pain reliever to take before the procedure.  You may have tests for: ? Pregnancy. A pregnancy test involves having a urine sample taken. ? STIs. Placing an IUD in someone who has an STI can make the infection worse. ? Cervical cancer. You may have a Pap test to check for this type of cancer. This means collecting cells from your cervix to be examined under a microscope.  You may have a physical exam to determine the size and position of your uterus. The procedure may vary among health care providers and hospitals. What happens during the procedure?  A tool (speculum) will be placed in your vagina and widened so that your health care provider can see your cervix.  Medicine may be applied to your cervix to help lower your risk of infection (antiseptic medicine).  You may be given an anesthetic medicine to numb each side of your cervix (intracervical block or paracervical block). This medicine is usually given by an injection into the cervix.  A tool (uterine sound) will be inserted into your uterus to determine the length of your uterus and the direction that your uterus may be tilted.  A slim instrument or tube (IUD inserter) that holds the IUD will be inserted into your vagina, through your cervical canal, and into your uterus.  The IUD will be placed in the uterus, and the IUD  inserter will be removed.  The strings that are attached to the IUD will be trimmed so that they lie just below the cervix. The procedure may vary among health care providers and hospitals. What happens after the procedure?  You may have bleeding after the procedure. This is normal. It varies from light bleeding (spotting) for a few days to menstrual-like bleeding.  You may have cramping and pain.  You may feel dizzy or light-headed.  You may have lower back pain. Summary  An intrauterine device (IUD) is a small, T-shaped device that has one or two nylon strings hanging down from it.  Two types of IUDs are available. You may have a copper IUD or a hormone IUD.  Schedule the IUD insertion for when you will have your menstrual period or right after, to make sure you are not pregnant. Placement of the IUD is better tolerated shortly after a menstrual cycle.  You may have bleeding after the procedure. This is normal. It varies from light spotting for a few days to menstrual-like bleeding. This information is not intended to replace advice given to you by your health care provider. Make sure you discuss any questions you have with your health care provider. Document Revised: 02/25/2017 Document Reviewed: 02/04/2016 Elsevier Patient Education  2020 [employer].

## 2019-07-19 NOTE — Progress Notes (Signed)
History: 24 year old SF G2P2 is here to discuss contraceptive options. Has tried Depo in the past but was having irregular cycles, multiple times a month.  Has tried Ortho-Cyclen and Loestrin OCP but says it makes her feel bad and she would like to try something different.  Husband plans to get a vasectomy at some point.  Pap 05/09/2019-+ LGSIL, repeat colposcopy in 6 months.  Has a history of PCOS.  Interpreter present.  Exam: Appears well, NAD  Assessment Contraception counseling/management   Plan: Educated on progesterone only options, estrogen is most likely source of her symptoms since she did fine on Depo.  Self-pay, provided her with the cost of each.  We will schedule IUD insertion for next week as she is on her menses now.  Written material provided on IUD insertion and management. Instructed to abstain from sexual intercourse or continue OCP until insertion.

## 2019-07-24 ENCOUNTER — Other Ambulatory Visit: Payer: Self-pay

## 2019-07-25 ENCOUNTER — Ambulatory Visit (INDEPENDENT_AMBULATORY_CARE_PROVIDER_SITE_OTHER): Payer: Self-pay | Admitting: Obstetrics and Gynecology

## 2019-07-25 DIAGNOSIS — Z30430 Encounter for insertion of intrauterine contraceptive device: Secondary | ICD-10-CM

## 2019-07-25 NOTE — Progress Notes (Signed)
[facility] Gynecology Note  Patient was here for an IUD placement but is more than a week after her LMP and she has been sexually active within the last month, intermittently taking the oral contraceptive pills due to intolerance.  With the Spanish interpreter present, we discussed that this would not be a good time to place an IUD due to possibility of pregnancy, and I encouraged her to return when she is on her menstrual period, or, she needs to abstain from intercourse for at least a month and we could confirm a negative pregnancy test and then place the device at a time when she is not on her menstrual period.  She acknowledges understanding and will plan to make an appointment when she has her next period.  No charge for this encounter.  Theresia Majors MD

## 2019-10-06 ENCOUNTER — Emergency Department (HOSPITAL_COMMUNITY)
Admission: EM | Admit: 2019-10-06 | Discharge: 2019-10-06 | Disposition: A | Discharge status: Home / Self Care | Payer: Self-pay | Attending: Emergency Medicine | Admitting: Emergency Medicine

## 2019-10-06 ENCOUNTER — Encounter (HOSPITAL_COMMUNITY): Payer: Self-pay

## 2019-10-06 ENCOUNTER — Other Ambulatory Visit: Payer: Self-pay

## 2019-10-06 ENCOUNTER — Emergency Department (HOSPITAL_COMMUNITY): Payer: Self-pay

## 2019-10-06 DIAGNOSIS — O219 Vomiting of pregnancy, unspecified: Secondary | ICD-10-CM | POA: Insufficient documentation

## 2019-10-06 DIAGNOSIS — R1013 Epigastric pain: Secondary | ICD-10-CM

## 2019-10-06 DIAGNOSIS — R109 Unspecified abdominal pain: Secondary | ICD-10-CM | POA: Insufficient documentation

## 2019-10-06 DIAGNOSIS — Z3A01 Less than 8 weeks gestation of pregnancy: Secondary | ICD-10-CM | POA: Insufficient documentation

## 2019-10-06 DIAGNOSIS — O26891 Other specified pregnancy related conditions, first trimester: Secondary | ICD-10-CM | POA: Insufficient documentation

## 2019-10-06 LAB — COMPREHENSIVE METABOLIC PANEL
ALT: 18 U/L (ref 0–44)
AST: 16 U/L (ref 15–41)
Albumin: 5.1 g/dL — ABNORMAL HIGH (ref 3.5–5.0)
Alkaline Phosphatase: 60 U/L (ref 38–126)
Anion gap: 12 (ref 5–15)
BUN: 17 mg/dL (ref 6–20)
CO2: 22 mmol/L (ref 22–32)
Calcium: 9.5 mg/dL (ref 8.9–10.3)
Chloride: 102 mmol/L (ref 98–111)
Creatinine, Ser: 0.55 mg/dL (ref 0.44–1.00)
GFR calc Af Amer: >60 mL/min (ref >60)
GFR calc non Af Amer: >60 mL/min (ref >60)
Glucose, Bld: 100 mg/dL — ABNORMAL HIGH (ref 70–99)
Potassium: 3.7 mmol/L (ref 3.5–5.1)
Sodium: 136 mmol/L (ref 135–145)
Total Bilirubin: 2.7 mg/dL — ABNORMAL HIGH (ref 0.3–1.2)
Total Protein: 8.8 g/dL — ABNORMAL HIGH (ref 6.5–8.1)

## 2019-10-06 LAB — CBC
HCT: 38.9 % (ref 36.0–46.0)
Hemoglobin: 13.3 g/dL (ref 12.0–15.0)
MCH: 30.6 pg (ref 26.0–34.0)
MCHC: 34.2 g/dL (ref 30.0–36.0)
MCV: 89.4 fL (ref 80.0–100.0)
Platelets: 271 10*3/uL (ref 150–400)
RBC: 4.35 MIL/uL (ref 3.87–5.11)
RDW: 12 % (ref 11.5–15.5)
WBC: 11.8 10*3/uL — ABNORMAL HIGH (ref 4.0–10.5)
nRBC: 0 % (ref 0.0–0.2)

## 2019-10-06 LAB — URINALYSIS, ROUTINE W REFLEX MICROSCOPIC
Bilirubin Urine: NEGATIVE
Glucose, UA: NEGATIVE mg/dL
Hgb urine dipstick: NEGATIVE
Ketones, ur: 80 mg/dL — AB
Leukocytes,Ua: NEGATIVE
Nitrite: NEGATIVE
Protein, ur: 30 mg/dL — AB
Specific Gravity, Urine: 1.034 — ABNORMAL HIGH (ref 1.005–1.030)
pH: 6 (ref 5.0–8.0)

## 2019-10-06 LAB — I-STAT BETA HCG BLOOD, ED (MC, WL, AP ONLY): I-stat hCG, quantitative: >2000 m[IU]/mL — ABNORMAL HIGH (ref <5)

## 2019-10-06 LAB — LIPASE, BLOOD: Lipase: 29 U/L (ref 11–51)

## 2019-10-06 MED ORDER — SUCRALFATE 1 GM/10ML PO SUSP
1.0000 g | Freq: Three times a day (TID) | ORAL | 0 refills | Status: DC
Start: 2019-10-06 — End: 2020-05-25

## 2019-10-06 MED ORDER — SUCRALFATE 1 GM/10ML PO SUSP
1.0000 g | Freq: Once | ORAL | Status: AC
  Administered 2019-10-06: 1 g via ORAL
  Filled 2019-10-06: qty 10

## 2019-10-06 MED ORDER — ONDANSETRON 4 MG PO TBDP
4.0000 mg | ORAL_TABLET | Freq: Three times a day (TID) | ORAL | 0 refills | Status: DC | PRN
Start: 2019-10-06 — End: 2020-05-25

## 2019-10-06 MED ORDER — ONDANSETRON HCL 4 MG/2ML IJ SOLN
4.0000 mg | Freq: Once | INTRAMUSCULAR | Status: AC
  Administered 2019-10-06: 4 mg via INTRAVENOUS
  Filled 2019-10-06: qty 2

## 2019-10-06 MED ORDER — DOXYLAMINE-PYRIDOXINE 10-10 MG PO TBEC: DELAYED_RELEASE_TABLET | ORAL | 0 refills | Status: DC

## 2019-10-06 MED ORDER — LACTATED RINGERS IV BOLUS
2000.0000 mL | Freq: Once | INTRAVENOUS | Status: AC
  Administered 2019-10-06: 2000 mL via INTRAVENOUS

## 2019-10-06 NOTE — ED Triage Notes (Signed)
Pt sts vomiting all day and [redacted] weeks pregnant.

## 2019-10-06 NOTE — ED Provider Notes (Signed)
[facility] DEPT Provider Note   CSN: [id number] Arrival date & time: 10/06/19  0011     History Chief Complaint  Patient presents with  . Emesis During Pregnancy    Lindsay Contreras is a 24 y.o. female who is G3P2A0 who presents to the ED with her husband for evaluation of N/V x 3 days.  Patient estimates she is about [redacted] weeks pregnant, her last menstrual period was 08/23/2019, she has had positive pregnancy test at home.  She states that she has had issues with nausea and vomiting over the past 3 days, she has been unable to keep anything down, she has associated burning discomfort to the epigastrium.  No alleviating or aggravating factors to her symptoms.  No intervention prior to arrival.  Patient denies fever, chills, hematemesis, melena, hematochezia, dysuria, vaginal bleeding, vaginal discharge, or pelvic pain.  She had similar problems with vomiting and one of her previous pregnancies, no other major complications.  Patient's husband assists in providing history.  Offered Patent attorney which patient and her husband declined, they are both speaking [place].  HPI     History reviewed. No pertinent past medical history.  Patient Active Problem List   Diagnosis Date Noted  . Active labor at term 10/23/2014  . Active labor 10/23/2014  . Evaluate anatomy not seen on prior sonogram   . [redacted] weeks gestation of pregnancy   . Encounter for fetal anatomic survey   . [redacted] weeks gestation of pregnancy     History reviewed. No pertinent surgical history.   OB History    Gravida  2   Para  2   Term  2   Preterm      AB      Living  2     SAB      TAB      Ectopic      Multiple  0   Live Births  2           Family History  Problem Relation Age of Onset  . Diabetes Mother     Social History   Tobacco Use  . Smoking status: Never Smoker  . Smokeless tobacco: Never Used  Vaping Use  . Vaping Use: Never used  Substance Use  Topics  . Alcohol use: Yes  . Drug use: Never    Home Medications Prior to Admission medications   Medication Sig Start Date End Date Taking? Authorizing Provider  ibuprofen (ADVIL,MOTRIN) 600 MG tablet Take 1 tablet (600 mg total) by mouth every 6 (six) hours as needed. 10/24/14   Myrtis Ser, CNM  NAPROXEN PO Take by mouth.    [provider]  norethindrone-ethinyl estradiol (LOESTRIN) 1-20 MG-MCG tablet Take 1 tablet by mouth daily. 06/25/19   Joseph Pierini, MD    Allergies    Patient has no known allergies.  Review of Systems   Review of Systems  Constitutional: Negative for chills and fever.  Respiratory: Negative for shortness of breath.   Cardiovascular: Negative for chest pain.  Gastrointestinal: Positive for abdominal pain, nausea and vomiting. Negative for anal bleeding, blood in stool, constipation and diarrhea.  Genitourinary: Negative for dysuria, pelvic pain, vaginal bleeding and vaginal discharge.  Neurological: Negative for syncope.  All other systems reviewed and are negative.   Physical Exam Updated Vital Signs BP 106/72 (BP Location: Left Arm)   Pulse 66   Temp 98.6 F (37 C) (Oral)   Resp 16   Ht 5' 3"  (  1.6 m)   Wt 67.1 kg   SpO2 98%   BMI 26.22 kg/m   Physical Exam Vitals and nursing note reviewed.  Constitutional:      General: She is not in acute distress.    Appearance: She is well-developed. She is not toxic-appearing.  HENT:     Head: Normocephalic and atraumatic.     Mouth/Throat:     Mouth: Mucous membranes are moist.  Eyes:     General:        Right eye: No discharge.        Left eye: No discharge.     Conjunctiva/sclera: Conjunctivae normal.  Cardiovascular:     Rate and Rhythm: Normal rate and regular rhythm.  Pulmonary:     Effort: Pulmonary effort is normal. No respiratory distress.     Breath sounds: Normal breath sounds. No wheezing, rhonchi or rales.  Abdominal:     General: There is no distension.      Palpations: Abdomen is soft.     Tenderness: There is abdominal tenderness in the epigastric area. There is no guarding or rebound. Negative signs include Murphy's sign and McBurney's sign.  Musculoskeletal:     Cervical back: Neck supple.  Skin:    General: Skin is warm and dry.     Findings: No rash.  Neurological:     General: No focal deficit present.     Mental Status: She is alert.     Comments: Clear speech.   Psychiatric:        Behavior: Behavior normal.     ED Results / Procedures / Treatments   Labs (all labs ordered are listed, but only abnormal results are displayed) Labs Reviewed  COMPREHENSIVE METABOLIC PANEL - Abnormal; Notable for the following components:      Result Value   Glucose, Bld 100 (*)    Total Protein 8.8 (*)    Albumin 5.1 (*)    Total Bilirubin 2.7 (*)    All other components within normal limits  CBC - Abnormal; Notable for the following components:   WBC 11.8 (*)    All other components within normal limits  I-STAT BETA HCG BLOOD, ED ([facility], [facility], [facility] ONLY) - Abnormal; Notable for the following components:   I-stat hCG, quantitative >2,000.0 (*)    All other components within normal limits  LIPASE, BLOOD  URINALYSIS, ROUTINE W REFLEX MICROSCOPIC    EKG None  Radiology No results found.  Procedures Procedures (including critical care time)  Medications Ordered in ED Medications  lactated ringers bolus 2,000 mL (has no administration in time range)  ondansetron (ZOFRAN) injection 4 mg (has no administration in time range)    ED Course  I have reviewed the triage vital signs and the nursing notes.  Pertinent labs & imaging results that were available during my care of the patient were reviewed by me and considered in my medical decision making (see chart for details).  Lindsay Contreras was evaluated in Emergency Department on 10/06/2019 for the symptoms described in the history of present illness. He/she was evaluated in the context of the  global COVID-19 pandemic, which necessitated consideration that the patient might be at risk for infection with the SARS-CoV-2 virus that causes COVID-19. Institutional protocols and algorithms that pertain to the evaluation of patients at risk for COVID-19 are in a state of rapid change based on information released by regulatory bodies including the CDC and federal and state organizations. These policies and algorithms were followed during  the patient's care in the ED.    MDM Rules/Calculators/A&P                         Patient presents to the ED with complaints of nausea and vomiting in pregnancy.  Patient is nontoxic, resting comfortably, her vitals are within normal limits.  On exam she has some epigastric abdominal tenderness without peritoneal signs.  Negative Murphy sign.  She does not have any lower abdominal tenderness or suprapubic tenderness.  Additional history obtained:  Additional history obtained from patient's husband at bedside.. Previous records obtained and reviewed.   Lab Tests:  I Ordered, reviewed, and interpreted labs, which included:  CBC: Mild leukocytosis felt to be nonspecific. CMP: Mildly elevated total protein and albumin.  Her total bilirubin is also noted to be mildly elevated, her LFTs and alk phos are within normal limits. Lipase: Within normal limits Pregnancy test: Positive Urinalysis: Pending Imaging Studies ordered:  I ordered imaging studies which included RUQ [place], I independently visualized and interpreted imaging which showed no acute process.   I offered pelvic ultrasound as patient has not had 1 of these yet, she and her husband declined, I do not feel this is emergently necessary given she is not having any pelvic pain or vaginal bleeding to raise concern for ectopic pregnancy.   ED Course:  Patient seen, ordered 4 mg of IV Zofran with 2 L of lactated Ringer's.  06:25: RE-EVAL: Patient having improvement in nausea, no emesis since last evaluation,  fluids are running, she remains with some burning discomfort to the epigastrium, will trial Carafate.  06:28: Patient care signed out to [facility] PA-C at change of shift pending UA & disposition. If able to tolerate PO and feeling improved anticipate discharge home.   Findings and plan of care discussed with supervising physician Dr. Stark Jock who is in agreement.   Portions of this note were generated with Lobbyist. Dictation errors may occur despite best attempts at proofreading.  Final Clinical Impression(s) / ED Diagnoses Final diagnoses:  Abdominal pain  Nausea and vomiting during pregnancy    Rx / DC Orders ED Discharge Orders    None       Amaryllis Dyke, PA-C 10/06/19 1610    Veryl Speak, MD 10/06/19 [phone]

## 2019-10-06 NOTE — ED Provider Notes (Signed)
Care assumed from [facility], PA-C at shift change pending UA and po challenge. See her note for full HPI.  In short, patient is a 24 year old female who is G3, P2 who presents the ED due to nausea vomiting x3 days.  Patient believes she is roughly [redacted] weeks pregnant with last menstrual cycle on 08/23/2019.  Admits to decreased p.o. intake associated with epigastric burning.   Physical Exam  BP 106/72 (BP Location: Left Arm)   Pulse 66   Temp 98.6 F (37 C) (Oral)   Resp 16   Ht 5\' 3"  (1.6 m)   Wt 67.1 kg   SpO2 98%   BMI 26.22 kg/m   Physical Exam Vitals and nursing note reviewed.  Constitutional:      General: She is not in acute distress.    Appearance: She is not ill-appearing.  HENT:     Head: Normocephalic.  Eyes:     Pupils: Pupils are equal, round, and reactive to light.  Cardiovascular:     Rate and Rhythm: Normal rate and regular rhythm.     Pulses: Normal pulses.     Heart sounds: Normal heart sounds. No murmur heard.  No friction rub. No gallop.   Pulmonary:     Effort: Pulmonary effort is normal.     Breath sounds: Normal breath sounds.  Abdominal:     General: Abdomen is flat. Bowel sounds are normal. There is no distension.     Palpations: Abdomen is soft.     Tenderness: There is no abdominal tenderness. There is no guarding or rebound.     Comments: Abdomen soft, nondistended, nontender to palpation in all quadrants without guarding or peritoneal signs. No rebound.   Musculoskeletal:     Cervical back: Neck supple.     Comments: Able to move all 4 extremities without difficulty.   Skin:    General: Skin is warm and dry.  Neurological:     General: No focal deficit present.     Mental Status: She is alert.  Psychiatric:        Mood and Affect: Mood normal.        Behavior: Behavior normal.     ED Course/Procedures   Clinical Course as of Oct 06 706  Sat Oct 06, 2019  0641 Bacteria, UA(!): RARE [CA]  0707 Leukocytes,Ua: NEGATIVE [CA]   0708 Nitrite: NEGATIVE [CA]    Clinical Course User Index [CA] 2841, PA-C   Results for orders placed or performed during the hospital encounter of 10/06/19 (from the past 24 hour(s))  Lipase, blood     Status: None   Collection Time: 10/06/19  2:24 AM  Result Value Ref Range   Lipase 29 11 - 51 U/L  Comprehensive metabolic panel     Status: Abnormal   Collection Time: 10/06/19  2:24 AM  Result Value Ref Range   Sodium 136 135 - 145 mmol/L   Potassium 3.7 3.5 - 5.1 mmol/L   Chloride 102 98 - 111 mmol/L   CO2 22 22 - 32 mmol/L   Glucose, Bld 100 (H) 70 - 99 mg/dL   BUN 17 6 - 20 mg/dL   Creatinine, Ser 12/07/19 0.44 - 1.00 mg/dL   Calcium 9.5 8.9 - 3.24 mg/dL   Total Protein 8.8 (H) 6.5 - 8.1 g/dL   Albumin 5.1 (H) 3.5 - 5.0 g/dL   AST 16 15 - 41 U/L   ALT 18 0 - 44 U/L   Alkaline Phosphatase 60  38 - 126 U/L   Total Bilirubin 2.7 (H) 0.3 - 1.2 mg/dL   GFR calc non Af Amer >60 >60 mL/min   GFR calc Af Amer >60 >60 mL/min   Anion gap 12 5 - 15  CBC     Status: Abnormal   Collection Time: 10/06/19  2:24 AM  Result Value Ref Range   WBC 11.8 (H) 4.0 - 10.5 K/uL   RBC 4.35 3.87 - 5.11 MIL/uL   Hemoglobin 13.3 12.0 - 15.0 g/dL   HCT 93.2 36 - 46 %   MCV 89.4 80.0 - 100.0 fL   MCH 30.6 26.0 - 34.0 pg   MCHC 34.2 30.0 - 36.0 g/dL   RDW 35.5 73.2 - 20.2 %   Platelets 271 150 - 400 K/uL   nRBC 0.0 0.0 - 0.2 %  I-Stat beta hCG blood, ED     Status: Abnormal   Collection Time: 10/06/19  2:34 AM  Result Value Ref Range   I-stat hCG, quantitative >2,000.0 (H) <5 mIU/mL   Comment 3            Procedures  MDM  Care assumed from [facility], PA-C at shift change pending UA and po challenge. See her note for full MDM.  Patient is a 24 year old female who is roughly [redacted] weeks pregnant who presents to the ED due to 3 days of nausea and vomiting associated with epigastric pain.  Emesis is nonbloody and nonbilious.  Vitals throughout patient's ED stay have remained  unremarkable.  Patient is afebrile, not tachycardic or hypoxic.  Prior shift handoff patient given 2 L lactated Ringer's and Zofran.  Carafate order placed just prior to shift change.  Pregnancy test positive.  Lipase normal at 29.  CBC significant for mild leukocytosis at 11.8.  CMP significant for elevated total bilirubin at 2.7, but otherwise reassuring with normal renal function. RUQ [place] personally reviewed which is negative for any acute abnormalities.  6:40 AM patient able to tolerate po without difficulty here in the ED.   UA significant for ketonuria and proteinuria.  Rare bacteria.  Negative nitrites and leukocytes.  Low suspicion for infection.  Urine culture pending.  Patient continues to tolerate p.o. in the ED without difficulty and is ready to go home.  Will discharge patient with symptomatic relief.  OB/GYN referral given to patient at discharge.  Instructed patient to call OB/GYN on Monday to schedule appointment to establish care.  Advised patient to take over-the-counter Tylenol as needed for pain and to avoid any NSAIDs.  Discussed the importance of prenatal vitamins. Strict ED precautions discussed with patient. Patient states understanding and agrees to plan. Patient discharged home in no acute distress and stable vitals.   Mannie Stabile, PA-C 10/06/19 5427    Geoffery Lyons, MD 10/06/19 [phone]

## 2019-10-06 NOTE — Discharge Instructions (Addendum)
You were seen in the emergency department today for nausea and vomiting.  Your work-up was overall reassuring.  Your labs show that your protein was a bit elevated which can happen with dehydration.  It also showed that your total bilirubin was mildly elevated, we did an ultrasound of your gallbladder/liver which was reassuring.  You are sending you home with Diclegis to take as needed for nausea and vomiting, please take this as prescribed.  If your nausea and vomiting are not controlled by Diclegis we have provided a few tablets of Zofran to take every 8 hours as needed for uncontrolled nausea and vomiting.  We are also sending you at home to take prior to meals and bedtime to help with stomach pain.   We have prescribed you new medication(s) today. Discuss the medications prescribed today with your pharmacist as they can have adverse effects and interactions with your other medicines including over the counter and prescribed medications. Seek medical evaluation if you start to experience new or abnormal symptoms after taking one of these medicines, seek care immediately if you start to experience difficulty breathing, feeling of your throat closing, facial swelling, or rash as these could be indications of a more serious allergic reaction.   Please follow-up with OB/GYN within 3 days.  Return to the ER for new or worsening symptoms including but not limited to inability to keep fluids down, fever, new abdominal pain, pelvic pain, vaginal bleeding, pain with urination, passing out, dizziness, or any other concerns.

## 2019-10-07 LAB — URINE CULTURE: Culture: <10000 — AB

## 2019-11-14 ENCOUNTER — Encounter: Payer: Self-pay | Admitting: General Practice

## 2019-12-05 ENCOUNTER — Encounter: Payer: Self-pay | Admitting: Student

## 2019-12-06 LAB — OB RESULTS CONSOLE HEPATITIS B SURFACE ANTIGEN: Hepatitis B Surface Ag: NEGATIVE

## 2019-12-06 LAB — OB RESULTS CONSOLE HIV ANTIBODY (ROUTINE TESTING): HIV: NONREACTIVE

## 2020-03-29 NOTE — L&D Delivery Note (Addendum)
Delivery Note  Gertha Lichtenberg is a 25 y.o. female G3P2002 with IUP at [redacted]w[redacted]d admitted for normal labor.  She progressed with AROM to complete and pushed spontaneously to deliver.    At 07:47 a viable and healthy female was delivered vaginally via   (Presentation: vertex ; LOA ).    APGAR:9 ,9 ;   weight  Pending   Cord clamping delayed by several minutes then clamped by CNM and cut by husband.     Placenta intact and spontaneous, bleeding minimal. Fundal Massage and Pit. Infusing.   Anesthesia:  none Episiotomy:  none Lacerations:  right and left labial micro-tear noted, no repair needed Suture Repair: none Est. Blood Loss (mL):  100  Mom and baby stable prior to transfer to postpartum.   Baby to Couplet care / Skin to Skin.  She plans on breastfeeding and formula.   She requests IUD or tubal for birth control. Deciding on whether it is more cost effective to get it done in hospital or in office.   Juliann Pares, Student-MidWife [employer] 05/24/2020, 8:02 AM

## 2020-04-03 LAB — OB RESULTS CONSOLE HGB/HCT, BLOOD
HCT: 31 (ref 29–41)
Hemoglobin: 10.3

## 2020-04-03 LAB — OB RESULTS CONSOLE PLATELET COUNT: Platelets: 294

## 2020-05-01 LAB — OB RESULTS CONSOLE GC/CHLAMYDIA
Chlamydia: NEGATIVE
Gonorrhea: NEGATIVE

## 2020-05-01 LAB — OB RESULTS CONSOLE GBS: GBS: NEGATIVE

## 2020-05-24 ENCOUNTER — Encounter (HOSPITAL_COMMUNITY): Payer: Self-pay | Admitting: Obstetrics and Gynecology

## 2020-05-24 ENCOUNTER — Inpatient Hospital Stay (HOSPITAL_COMMUNITY)
Admission: AD | Admit: 2020-05-24 | Discharge: 2020-05-25 | DRG: 807 | Disposition: A | Discharge status: Home / Self Care | Payer: Medicaid Other | Attending: Obstetrics and Gynecology | Admitting: Obstetrics and Gynecology

## 2020-05-24 ENCOUNTER — Other Ambulatory Visit: Payer: Self-pay

## 2020-05-24 DIAGNOSIS — Z20822 Contact with and (suspected) exposure to covid-19: Secondary | ICD-10-CM | POA: Diagnosis present

## 2020-05-24 DIAGNOSIS — G8918 Other acute postprocedural pain: Secondary | ICD-10-CM | POA: Diagnosis not present

## 2020-05-24 DIAGNOSIS — Z23 Encounter for immunization: Secondary | ICD-10-CM | POA: Diagnosis not present

## 2020-05-24 DIAGNOSIS — Z3A39 39 weeks gestation of pregnancy: Secondary | ICD-10-CM | POA: Diagnosis not present

## 2020-05-24 DIAGNOSIS — O99355 Diseases of the nervous system complicating the puerperium: Secondary | ICD-10-CM | POA: Diagnosis not present

## 2020-05-24 DIAGNOSIS — O26893 Other specified pregnancy related conditions, third trimester: Secondary | ICD-10-CM | POA: Diagnosis present

## 2020-05-24 LAB — RESP PANEL BY RT-PCR (FLU A&B, COVID) ARPGX2
Influenza A by PCR: NEGATIVE
Influenza B by PCR: NEGATIVE
SARS Coronavirus 2 by RT PCR: NEGATIVE

## 2020-05-24 LAB — TYPE AND SCREEN
ABO/RH(D): O POS
Antibody Screen: NEGATIVE

## 2020-05-24 LAB — CBC
HCT: 32.3 % — ABNORMAL LOW (ref 36.0–46.0)
Hemoglobin: 10.4 g/dL — ABNORMAL LOW (ref 12.0–15.0)
MCH: 27.3 pg (ref 26.0–34.0)
MCHC: 32.2 g/dL (ref 30.0–36.0)
MCV: 84.8 fL (ref 80.0–100.0)
Platelets: 279 10*3/uL (ref 150–400)
RBC: 3.81 MIL/uL — ABNORMAL LOW (ref 3.87–5.11)
RDW: 15.2 % (ref 11.5–15.5)
WBC: 9.4 10*3/uL (ref 4.0–10.5)
nRBC: 0.2 % (ref 0.0–0.2)

## 2020-05-24 LAB — RPR: RPR Ser Ql: NONREACTIVE

## 2020-05-24 LAB — GROUP B STREP BY PCR: Group B strep by PCR: NEGATIVE

## 2020-05-24 MED ORDER — ONDANSETRON HCL 4 MG PO TABS
4.0000 mg | ORAL_TABLET | ORAL | Status: DC | PRN
Start: 2020-05-24 — End: 2020-05-24

## 2020-05-24 MED ORDER — PRENATAL MULTIVITAMIN CH: 1.0000 | ORAL_TABLET | Freq: Every day | ORAL | Status: DC

## 2020-05-24 MED ORDER — SENNOSIDES-DOCUSATE SODIUM 8.6-50 MG PO TABS: 2.0000 | ORAL_TABLET | Freq: Every day | ORAL | Status: DC

## 2020-05-24 MED ORDER — LACTATED RINGERS IV SOLN: 500.0000 mL | INTRAVENOUS | Status: DC | PRN

## 2020-05-24 MED ORDER — LIDOCAINE HCL (PF) 1 % IJ SOLN: 30.0000 mL | INTRAMUSCULAR | Status: DC | PRN

## 2020-05-24 MED ORDER — LACTATED RINGERS IV SOLN: INTRAVENOUS | Status: DC

## 2020-05-24 MED ORDER — BENZOCAINE-MENTHOL 20-0.5 % EX AERO: 1.0000 "application " | INHALATION_SPRAY | CUTANEOUS | Status: DC | PRN

## 2020-05-24 MED ORDER — SENNOSIDES-DOCUSATE SODIUM 8.6-50 MG PO TABS
2.0000 | ORAL_TABLET | Freq: Every day | ORAL | Status: DC
  Administered 2020-05-25: 2 via ORAL
  Filled 2020-05-24: qty 2

## 2020-05-24 MED ORDER — SOD CITRATE-CITRIC ACID 500-334 MG/5ML PO SOLN: 30.0000 mL | ORAL | Status: DC | PRN

## 2020-05-24 MED ORDER — ONDANSETRON HCL 4 MG/2ML IJ SOLN: 4.0000 mg | INTRAMUSCULAR | Status: DC | PRN

## 2020-05-24 MED ORDER — OXYCODONE-ACETAMINOPHEN 5-325 MG PO TABS: 2.0000 | ORAL_TABLET | ORAL | Status: DC | PRN

## 2020-05-24 MED ORDER — SIMETHICONE 80 MG PO CHEW: 80.0000 mg | CHEWABLE_TABLET | ORAL | Status: DC | PRN

## 2020-05-24 MED ORDER — PRENATAL MULTIVITAMIN CH
1.0000 | ORAL_TABLET | Freq: Every day | ORAL | Status: DC
  Administered 2020-05-24 – 2020-05-25 (×2): 1 via ORAL
  Filled 2020-05-24 (×2): qty 1

## 2020-05-24 MED ORDER — DIBUCAINE (PERIANAL) 1 % EX OINT: 1.0000 "application " | TOPICAL_OINTMENT | CUTANEOUS | Status: DC | PRN

## 2020-05-24 MED ORDER — ACETAMINOPHEN 325 MG PO TABS: 650.0000 mg | ORAL_TABLET | ORAL | Status: DC | PRN

## 2020-05-24 MED ORDER — TETANUS-DIPHTH-ACELL PERTUSSIS 5-2.5-18.5 LF-MCG/0.5 IM SUSY: 0.5000 mL | PREFILLED_SYRINGE | Freq: Once | INTRAMUSCULAR | Status: DC

## 2020-05-24 MED ORDER — TETANUS-DIPHTH-ACELL PERTUSSIS 5-2.5-18.5 LF-MCG/0.5 IM SUSY
0.5000 mL | PREFILLED_SYRINGE | Freq: Once | INTRAMUSCULAR | Status: AC
  Administered 2020-05-25: 0.5 mL via INTRAMUSCULAR
  Filled 2020-05-24: qty 0.5

## 2020-05-24 MED ORDER — OXYTOCIN BOLUS FROM INFUSION
333.0000 mL | Freq: Once | INTRAVENOUS | Status: AC
  Administered 2020-05-24: 333 mL via INTRAVENOUS

## 2020-05-24 MED ORDER — WITCH HAZEL-GLYCERIN EX PADS: 1.0000 "application " | MEDICATED_PAD | CUTANEOUS | Status: DC | PRN

## 2020-05-24 MED ORDER — ZOLPIDEM TARTRATE 5 MG PO TABS: 5.0000 mg | ORAL_TABLET | Freq: Every evening | ORAL | Status: DC | PRN

## 2020-05-24 MED ORDER — COCONUT OIL OIL: 1.0000 "application " | TOPICAL_OIL | Status: DC | PRN

## 2020-05-24 MED ORDER — IBUPROFEN 600 MG PO TABS: 600.0000 mg | ORAL_TABLET | Freq: Four times a day (QID) | ORAL | Status: DC

## 2020-05-24 MED ORDER — ONDANSETRON HCL 4 MG PO TABS: 4.0000 mg | ORAL_TABLET | ORAL | Status: DC | PRN

## 2020-05-24 MED ORDER — ACETAMINOPHEN 325 MG PO TABS
650.0000 mg | ORAL_TABLET | ORAL | Status: DC | PRN
  Administered 2020-05-24: 650 mg via ORAL
  Filled 2020-05-24: qty 2

## 2020-05-24 MED ORDER — OXYTOCIN-SODIUM CHLORIDE 30-0.9 UT/500ML-% IV SOLN
2.5000 [IU]/h | INTRAVENOUS | Status: DC
  Filled 2020-05-24: qty 500

## 2020-05-24 MED ORDER — ONDANSETRON HCL 4 MG/2ML IJ SOLN: 4.0000 mg | Freq: Four times a day (QID) | INTRAMUSCULAR | Status: DC | PRN

## 2020-05-24 MED ORDER — OXYCODONE-ACETAMINOPHEN 5-325 MG PO TABS: 1.0000 | ORAL_TABLET | ORAL | Status: DC | PRN

## 2020-05-24 MED ORDER — DIPHENHYDRAMINE HCL 25 MG PO CAPS: 25.0000 mg | ORAL_CAPSULE | Freq: Four times a day (QID) | ORAL | Status: DC | PRN

## 2020-05-24 MED ORDER — IBUPROFEN 600 MG PO TABS
600.0000 mg | ORAL_TABLET | Freq: Four times a day (QID) | ORAL | Status: DC
  Administered 2020-05-24 – 2020-05-25 (×5): 600 mg via ORAL
  Filled 2020-05-24 (×5): qty 1

## 2020-05-24 NOTE — MAU Note (Signed)
PT SAYS SHE GETS [facility] [place] . IN OFFICE ON THURS NO VE. GBS- UNSURE.

## 2020-05-24 NOTE — Lactation Note (Addendum)
This note was copied from a baby's chart. Lactation Consultation Note  Patient Name: Lindsay Contreras [id number] Date: 05/24/2020   Age:27 hours Attempted to see mom in labor and delivery.  Mom breastfeeding on arrival but via interpreter does not want/need to see lactation.  Will follow up with mom on the floor. Maternal Data    Feeding    LATCH Score                    Lactation Tools Discussed/Used    Interventions    Discharge    Consult Status      Neomia Dear 05/24/2020, 8:45 AM

## 2020-05-24 NOTE — Discharge Summary (Addendum)
Postpartum Discharge Summary     Patient Name: Lindsay Contreras DOB: 16-Apr-1995 MRN: [id number]  Date of admission: 05/24/2020 Delivery date:05/24/2020  Delivering provider: Fatima Blank A  Date of discharge: 05/25/2020  Admitting diagnosis: Normal labor [O80, Z37.9] Intrauterine pregnancy: [redacted]w[redacted]d    Secondary diagnosis:  Active Problems:   SVD (spontaneous vaginal delivery)  Additional problems: None    Discharge diagnosis: Term Pregnancy Delivered                                              Post partum procedures:N/A Augmentation: AROM Complications: None  Hospital course: Onset of Labor With Vaginal Delivery      25y.o. yo G3P2002 at 338w3das admitted in Active Labor on 05/24/2020. Patient had an uncomplicated labor course as follows:  Membrane Rupture Time/Date: 7:13 AM ,05/24/2020   Delivery Method:Vaginal, Spontaneous  Episiotomy: None  Lacerations:  None  Patient had an uncomplicated postpartum course.  She is ambulating, tolerating a regular diet, passing flatus, and urinating well. Patient is discharged home in stable condition on 05/25/20.  Newborn Data: Birth date:05/24/2020  Birth time:7:47 AM  Gender:Female  Living status:Living  Apgars:9 ,9  Weight:3645 g   Magnesium Sulfate received: No BMZ received: No Rhophylac:No MMR:No T-DaP:Given prenatally Flu: N/A Transfusion:No  Physical exam  Vitals:   05/24/20 1708 05/24/20 2143 05/25/20 0134 05/25/20 0502  BP: 102/71 103/62 94/60 98/65   Pulse: 65 66 61 (!) 59  Resp: 16 18 18 18   Temp: 99.2 F (37.3 C) 98.7 F (37.1 C) 98.9 F (37.2 C) 98.8 F (37.1 C)  TempSrc: Oral Oral Oral Oral  SpO2: 99% 99% 99% 100%  Weight:      Height:       General: alert, cooperative and no distress  Chest: Lungs CTA, Heart RRR Abdomen: Soft, Appropriately Tender at Fundus, NonDistended, BS x 4Q Lochia: appropriate Uterine Fundus: firm at umbilicus Incision: N/A DVT Evaluation: No evidence of DVT seen on  physical exam. No significant calf/ankle edema. Labs: Lab Results  Component Value Date   WBC 11.0 (H) 05/25/2020   HGB 9.0 (L) 05/25/2020   HCT 27.2 (L) 05/25/2020   MCV 84.7 05/25/2020   PLT 231 05/25/2020   CMP Latest Ref Rng & Units 10/06/2019  Glucose 70 - 99 mg/dL 100(H)  BUN 6 - 20 mg/dL 17  Creatinine 0.44 - 1.00 mg/dL 0.55  Sodium 135 - 145 mmol/L 136  Potassium 3.5 - 5.1 mmol/L 3.7  Chloride 98 - 111 mmol/L 102  CO2 22 - 32 mmol/L 22  Calcium 8.9 - 10.3 mg/dL 9.5  Total Protein 6.5 - 8.1 g/dL 8.8(H)  Total Bilirubin 0.3 - 1.2 mg/dL 2.7(H)  Alkaline Phos 38 - 126 U/L 60  AST 15 - 41 U/L 16  ALT 0 - 44 U/L 18   Edinburgh Score: No flowsheet data found.   After visit meds:  Allergies as of 05/25/2020   No Known Allergies     Medication List    STOP taking these medications   calcium carbonate 500 MG chewable tablet Commonly known as: TUMS - dosed in mg elemental calcium   Doxylamine-Pyridoxine 10-10 MG Tbec Commonly known as: Diclegis   norethindrone-ethinyl estradiol 1-20 MG-MCG tablet Commonly known as: LOESTRIN   ondansetron 4 MG disintegrating tablet Commonly known as: Zofran ODT   pyridOXINE 100 MG tablet Commonly  known as: VITAMIN B-6   sucralfate 1 GM/10ML suspension Commonly known as: Carafate     TAKE these medications   ibuprofen 600 MG tablet Commonly known as: ADVIL Take 1 tablet (600 mg total) by mouth every 6 (six) hours as needed.        Discharge home in stable condition Infant Feeding: Breast Infant Disposition:home with mother Discharge instruction: per After Visit Summary and Postpartum booklet. Activity: Advance as tolerated. Pelvic rest for 6 weeks.  Diet: routine diet Future Appointments:No future appointments. Follow up Visit:  Follow-up Information    Glory Buff, MD. Schedule an appointment as soon as possible for a visit.   Specialty: Obstetrics and Gynecology Why: Call to make a postpartum appt.  Contact  information: [place] [address] [phone]                Pt to follow up postpartum with Dr Micah Noel in [facility].    She desires BTL vs IUD.  Is self pay so would like to discuss costs of contraceptive methods, decide which one to choose.  Discharge instructions given via spanish interpreter: Rica Mote [id number]  05/25/2020 Maryann Conners, CNM

## 2020-05-24 NOTE — H&P (Signed)
Obstetric History and Physical  Lindsay Contreras is a 25 y.o. [id number] with IUP at [redacted]w[redacted]d presenting with normal labor. Patient states she has been having  regular, every 3-4 minutes contractions, none vaginal bleeding, intact membranes, with active fetal movement.    Husband at bedside for support. Patient has to breath through contractions, rates her contractions an eight (8) out of ten (10) on the pain scale.  Denies difficulty breathing, respiratory distress, chest pain, vaginal bleeding, and leg swelling or pain.  Prenatal Course Source of Care: Dr. Shawnie Pons  Pregnancy complications or risks: none  Prenatal labs and studies: ABO, Rh:  O positive Antibody:  negative Rubella:  immune RPR:   nonreactive HBsAg:   nonreactive HIV:   nonreactive GBS: negative 1 hr Glucola : 130 Genetic screening normal Anatomy US normal  History reviewed. No pertinent past medical history.  History reviewed. No pertinent surgical history.  OB History  Gravida Para Term Preterm AB Living  3 2 2     2   SAB IAB Ectopic Multiple Live Births        0 2    # Outcome Date GA Lbr Len/2nd Weight Sex Delivery Anes PTL Lv  3 Current           2 Term 10/23/14 [redacted]w[redacted]d / 00:05 4030 g M Vag-Spont None  LIV  1 Term 2014    M        Social History   Socioeconomic History  . Marital status: Single    Spouse name: Not on file  . Number of children: Not on file  . Years of education: Not on file  . Highest education level: Not on file  Occupational History  . Not on file  Tobacco Use  . Smoking status: Never Smoker  . Smokeless tobacco: Never Used  Vaping Use  . Vaping Use: Never used  Substance and Sexual Activity  . Alcohol use: Yes  . Drug use: Never  . Sexual activity: Yes    Birth control/protection: Injection    Comment: Depo Provera-1st intercourse 25 yo-Fewer than 5 partners  Other Topics Concern  . Not on file  Social History Narrative  . Not on file   Social Determinants of Health    Financial Resource Strain: Not on file  Food Insecurity: Not on file  Transportation Needs: Not on file  Physical Activity: Not on file  Stress: Not on file  Social Connections: Not on file    Family History  Problem Relation Age of Onset  . Diabetes Mother     Medications Prior to Admission  Medication Sig Dispense Refill Last Dose  . calcium carbonate (TUMS - DOSED IN MG ELEMENTAL CALCIUM) 500 MG chewable tablet Chew 1 tablet by mouth daily.     . Doxylamine-Pyridoxine (DICLEGIS) 10-10 MG TBEC Initial: Two tablets at bedtime on day 1 and 2; if symptoms persist, take 1 tablet in morning and 2 tablets at bedtime on day 3; if symptoms persist, may increase to 1 tablet in morning, 1 tablet mid-afternoon, and 2 tablets at bedtime on day 4 (maximum: doxylamine 40 mg/pyridoxine 40 mg (4 tablets) per days. 60 tablet 0   . norethindrone-ethinyl estradiol (LOESTRIN) 1-20 MG-MCG tablet Take 1 tablet by mouth daily. (Patient not taking: Reported on 10/06/2019) 3 Package 4   . ondansetron (ZOFRAN ODT) 4 MG disintegrating tablet Take 1 tablet (4 mg total) by mouth every 8 (eight) hours as needed for nausea or vomiting. 5 tablet 0   . pyridOXINE (  VITAMIN B-6) 100 MG tablet Take 100 mg by mouth daily.     . sucralfate (CARAFATE) 1 GM/10ML suspension Take 10 mLs (1 g total) by mouth 4 (four) times daily -  with meals and at bedtime. 420 mL 0     No Known Allergies  Review of Systems: Negative except for what is mentioned in HPI.  Physical Exam: BP 120/86 (BP Location: Right Arm)   Pulse 81   Temp 98.5 F (36.9 C) (Oral)   Resp 20   Ht 5\' 3"  (1.6 m)   Wt 83 kg   LMP 07/16/2019   BMI 32.40 kg/m  GENERAL: Well-developed, well-nourished female in no acute distress.  LUNGS: Clear to auscultation bilaterally.  HEART: Regular rate and rhythm. ABDOMEN: Soft, nontender, nondistended, gravid. EXTREMITIES: Nontender, no edema, 2+ distal pulses.  Cervical Exam: Dilation: 5.5 Effacement (%):  80 Station: -2 Presentation: Vertex Exam by:: [email or web address], RN   FHT:  Baseline rate 140 bpm   Variability moderate  Accelerations present   Decelerations none Contractions: Every 3-5 mins Category I   Pertinent Labs/Studies:    Results for orders placed or performed during the hospital encounter of 05/24/20 (from the past 24 hour(s))  CBC     Status: Abnormal   Collection Time: 05/24/20  4:26 AM  Result Value Ref Range   WBC 9.4 4.0 - 10.5 K/uL   RBC 3.81 (L) 3.87 - 5.11 MIL/uL   Hemoglobin 10.4 (L) 12.0 - 15.0 g/dL   HCT 05/26/20 (L) 00.3 - 49.1 %   MCV 84.8 80.0 - 100.0 fL   MCH 27.3 26.0 - 34.0 pg   MCHC 32.2 30.0 - 36.0 g/dL   RDW 79.1 50.5 - 69.7 %   Platelets 279 150 - 400 K/uL   nRBC 0.2 0.0 - 0.2 %    Assessment : Lindsay Contreras is a 25 y.o. G3P2002 at [redacted]w[redacted]d being admitted for labor.  Plan:  Labor: Expectant management. Augmentation as needed, per protocol  FWB: Reassuring fetal heart tracing.  GBS negative  Delivery plan: Hopeful for vaginal delivery  [redacted]w[redacted]d, Student-MidWife [employer] 05/24/20 5:39 AM

## 2020-05-24 NOTE — Progress Notes (Signed)
Lindsay Contreras is a 25 y.o. G3P2002 at [redacted]w[redacted]d, admitted for active labor  Subjective: Patient in bed, breathing through contractions with husband at bedside. Spanish interpreter used. Request rupture of membranes.  Objective: BP 98/79   Pulse 76   Temp 98.3 F (36.8 C) (Oral)   Resp 18   Ht 5\' 3"  (1.6 m)   Wt 83 kg   LMP 07/16/2019   BMI 32.40 kg/m  No intake/output data recorded. No intake/output data recorded.  FHT:  FHR: 135 bpm, variability: moderate,  accelerations:  Present,  decelerations:  Absent UC:   regular, every 3-5 minutes  SVE:   Dilation: 7 Effacement (%): 100 Station: Plus 1 Exam by:: [email or web address], CNM  Labs: Lab Results  Component Value Date   WBC 9.4 05/24/2020   HGB 10.4 (L) 05/24/2020   HCT 32.3 (L) 05/24/2020   MCV 84.8 05/24/2020   PLT 279 05/24/2020    Assessment / Plan: Spontaneous labor, progressing normally AROM Labor: Progressing normally Preeclampsia:  no signs or symptoms of toxicity Fetal Wellbeing:  Category I Pain Control:  Labor support without medications I/D:  n/a Anticipated MOD:  NSVD  05/26/2020, Student-MidWife Juliann Pares 05/24/2020, 7:22 AM

## 2020-05-24 NOTE — Lactation Note (Signed)
This note was copied from a baby's chart. Lactation Consultation Note  Patient Name: Lindsay Contreras [id number] Date: 05/24/2020   Age:27 hours   LC Note:  No order for lactation placed; verified with RN that mother does not desire a lactation consult.     Maternal Data    Feeding Mother's Current Feeding Choice: Breast Milk and Formula  LATCH Score                    Lactation Tools Discussed/Used    Interventions    Discharge    Consult Status      Aurie Harroun R Lynnda Wiersma 05/24/2020, 12:56 PM

## 2020-05-25 DIAGNOSIS — O99355 Diseases of the nervous system complicating the puerperium: Secondary | ICD-10-CM

## 2020-05-25 DIAGNOSIS — G8918 Other acute postprocedural pain: Secondary | ICD-10-CM

## 2020-05-25 LAB — CBC
HCT: 27.2 % — ABNORMAL LOW (ref 36.0–46.0)
Hemoglobin: 9 g/dL — ABNORMAL LOW (ref 12.0–15.0)
MCH: 28 pg (ref 26.0–34.0)
MCHC: 33.1 g/dL (ref 30.0–36.0)
MCV: 84.7 fL (ref 80.0–100.0)
Platelets: 231 10*3/uL (ref 150–400)
RBC: 3.21 MIL/uL — ABNORMAL LOW (ref 3.87–5.11)
RDW: 15.5 % (ref 11.5–15.5)
WBC: 11 10*3/uL — ABNORMAL HIGH (ref 4.0–10.5)
nRBC: 0 % (ref 0.0–0.2)

## 2020-05-25 MED ORDER — IBUPROFEN 600 MG PO TABS: 600.0000 mg | ORAL_TABLET | Freq: Four times a day (QID) | ORAL | 1 refills | Status: DC | PRN

## 2020-05-25 NOTE — Discharge Instructions (Signed)
Eleccin del mtodo anticonceptivo Contraception Choices La anticoncepcin, o los mtodos anticonceptivos, hace referencia a los mtodos o dispositivos que evitan el [place]. Mtodos hormonales Implante anticonceptivo Un implante anticonceptivo consiste en un tubo delgado de plstico que contiene una hormona que evita el [place]. Es diferente de un dispositivo intrauterino (DIU). Un mdico lo inserta en la parte superior del brazo. Los implantes pueden ser eficaces durante un mximo de 3 aos. Inyecciones de progestina sola Las inyecciones de progestina sola contienen progestina, una forma sinttica de la hormona progesterona. Un mdico las administra cada 3 meses. Pldoras anticonceptivas Las pldoras anticonceptivas son pastillas que contienen hormonas que evitan el [place]. Deben tomarse una vez al da, preferentemente a la misma Economist. Se necesita una receta para utilizar este mtodo anticonceptivo. Parche anticonceptivo El parche anticonceptivo contiene hormonas que evitan el [place]. Se coloca en la piel, debe cambiarse una vez a la semana durante tres semanas y debe retirarse en la cuarta semana. Se necesita una receta para utilizar este mtodo anticonceptivo. Anillo vaginal Un anillo vaginal contiene hormonas que evitan el embarazo. Se coloca en la vagina durante tres semanas y se retira en la cuarta semana. Luego se repite el proceso con un anillo nuevo. Se necesita una receta para utilizar este mtodo anticonceptivo. Anticonceptivo de emergencia Los anticonceptivos de emergencia son mtodos para evitar un embarazo despus de Warehouse manager sexo sin proteccin. Vienen en forma de pldora y pueden tomarse hasta 5 das despus de [place]. Funcionan mejor cuando se toman lo ms pronto posible luego de [employer]. La mayora de los anticonceptivos de emergencia estn disponibles sin receta mdica. Este mtodo no debe utilizarse como el nico mtodo anticonceptivo.   Mtodos de  barrera Condn masculino Un condn masculino es una vaina delgada que se coloca sobre el pene durante el sexo. Los condones evitan que el esperma ingrese en el cuerpo de la [place]. Pueden utilizarse con un una sustancia que mata a los espermatozoides (espermicida) para aumentar la efectividad. Deben desecharse despus de un uso. Condn femenino Un condn femenino es una vaina blanda y holgada que se coloca en la vagina antes de [place]. El condn evita que el esperma ingrese en el cuerpo de la [place]. Deben desecharse despus de un uso. Diafragma Un diafragma es una barrera blanda con forma de cpula. Se inserta en la vagina antes del sexo, junto con un espermicida. El diafragma bloquea el ingreso de esperma en el tero, y el espermicida mata a los espermatozoides. El Designer, fashion/clothing en la vagina durante 6 a 8 horas despus de Warehouse manager sexo y debe retirarse en el plazo de las 24 horas. Un diafragma es recetado y colocado por un mdico. Debe reemplazarse cada 1 a 2 aos, despus de dar a luz, de aumentar ms de 15lb (6.8kg) y de [place] plvica. Capuchn cervical Un capuchn cervical es una copa redonda y blanda de ltex o plstico que se coloca en el cuello uterino. Se inserta en la vagina antes del sexo, junto con un espermicida. Bloquea el ingreso del esperma en el tero. El capuchn Radio producer durante 6 a 8 horas despus de Warehouse manager sexo y debe retirarse en el plazo de las 48 horas. Un capuchn cervical debe ser recetado y colocado por un mdico. Debe reemplazarse cada 2aos. Esponja Una esponja es una pieza blanda y circular de espuma de poliuretano que contiene espermicida. La esponja ayuda a bloquear el ingreso de esperma en el tero, y el espermicida  mata a los espermatozoides. Belva Bertin, debe humedecerla e insertarla en la vagina. Debe insertarse antes de [employer], debe permanecer dentro al menos durante 6 horas despus de tener sexo y debe retirarse y  Nurse, adult en el plazo de las 30 horas. Espermicidas Los espermicidas son sustancias qumicas que matan o bloquean al esperma y no lo dejan ingresar al cuello uterino y al tero. Vienen en forma de crema, gel, supositorio, espuma o comprimido. Un espermicida debe insertarse en la vagina con un aplicador al menos 10 o 15 minutos antes de tener sexo para dar tiempo a que surta [place]. El proceso debe repetirse cada vez que tenga sexo. Los espermicidas no requieren Emergency planning/management officer.   Anticonceptivos intrauterinos Dispositivo intrauterino (DIU) Un DIU es un dispositivo en forma de T que se coloca en el tero. Existen dos tipos:  DIU hormonal.Este tipo contiene progestina, una forma sinttica de la hormona progesterona. Este tipo puede permanecer colocado durante 3 a 5 aos.  DIU de cobre.Este tipo est recubierto con un alambre de cobre. Puede permanecer colocado durante 10 aos. Mtodos anticonceptivos permanentes Ligadura de trompas en la mujer En este mtodo, se sellan, atan u obstruyen las trompas de [place]op[place] durante una ciruga para Automotive engineer que el vulo descienda [place]. Esterilizacin histeroscpica En este mtodo, se coloca un implante pequeo y flexible dentro de cada trompa de Falopio. Los implantes hacen que se forme un tejido cicatricial en las trompas de Falopio y que las obstruya para que el espermatozoide no pueda llegar al vulo. El procedimiento demora alrededor de 3 meses para que sea [place]. Debe utilizarse otro mtodo anticonceptivo durante esos 3 meses. Esterilizacin masculina Este es un procedimiento que consiste en atar los conductos que transportan el esperma (vasectoma). Luego del procedimiento, el hombre Manufacturing engineer lquido (semen). Debe utilizarse otro mtodo anticonceptivo durante 3 meses despus del procedimiento. Mtodos de planificacin natural Planificacin familiar natural En este mtodo, la pareja no tiene [employer] la mujer podra quedar  [place]. Mtodo calendario En este mtodo, la mujer realiza un seguimiento de la duracin de cada ciclo menstrual, identifica los [employer] que se puede producir un Psychiatrist y no tiene sexo durante esos [address]  [address]. Mtodo de la ovulacin En este mtodo, la pareja evita tener sexo durante la ovulacin. Mtodo sintotrmico Este mtodo implica no tener sexo durante la ovulacin. Normalmente, la mujer comprueba la ovulacin al observar cambios en su temperatura y en la consistencia del moco cervical. Mtodo posovulacin En este mtodo, la pareja espera a que finalice la ovulacin para Doctor, hospital. Dnde buscar ms informacin  Centers for Disease Control and Prevention (Centros para el Control y Psychiatrist de Event organiser): [email or web address] Resumen  La anticoncepcin, o los mtodos anticonceptivos, hace referencia a los mtodos o dispositivos que evitan el [place].  Los mtodos anticonceptivos hormonales incluyen implantes, inyecciones, pastillas, parches, anillos vaginales y anticonceptivos de Associate Professor.  Los mtodos anticonceptivos de barrera pueden incluir condones masculinos, condones femeninos, diafragmas, capuchones cervicales, esponjas y espermicidas.  [employer] tipos de DIU (dispositivo intrauterino). Un DIU puede colocarse en el tero de una mujer para evitar el embarazo durante 3 a 5 aos.  La esterilizacin permanente puede realizarse mediante un procedimiento tanto en los hombres como en las mujeres. Los [employer] de Medical sales representative natural implican no tener [employer] la mujer podra quedar [place]. Esta informacin no tiene Theme park manager el consejo del mdico. Asegrese de hacerle al mdico cualquier pregunta que tenga.  Document Revised: 10/16/2019 Document Reviewed: 10/16/2019 Elsevier Patient Education  2021 [employer].

## 2020-12-08 IMAGING — US US ABDOMEN LIMITED
1 series · 14 of 25 positions shown · non-contrast
Comparison: None.

CLINICAL DATA: Abdominal pain.  Pregnant.

EXAM:
ULTRASOUND ABDOMEN LIMITED RIGHT UPPER QUADRANT

[Series 1: us abdomen limited · 14 of 53 slices shown]
[im 1/53]
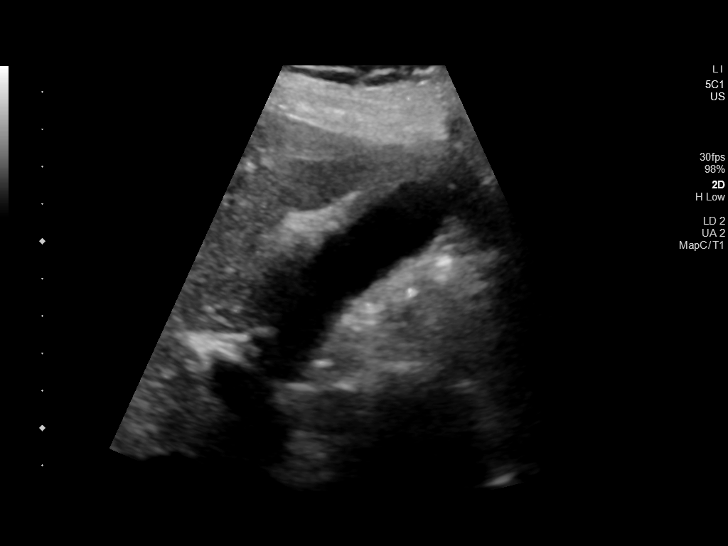
[im 5/53]
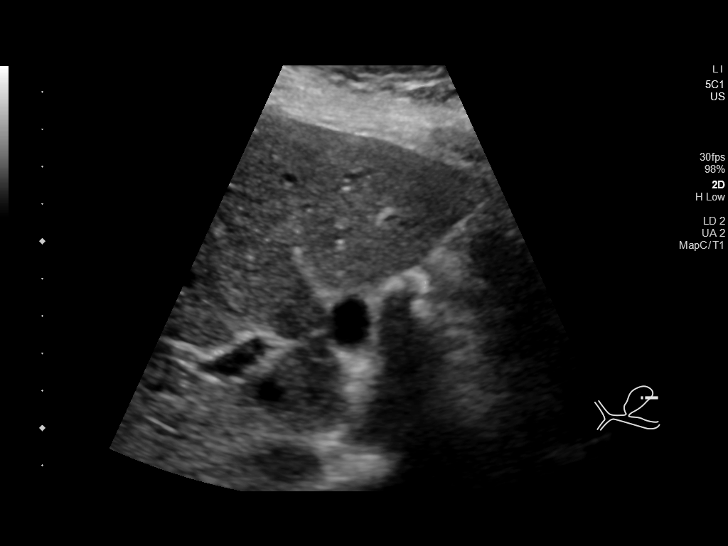
[im 9/53]
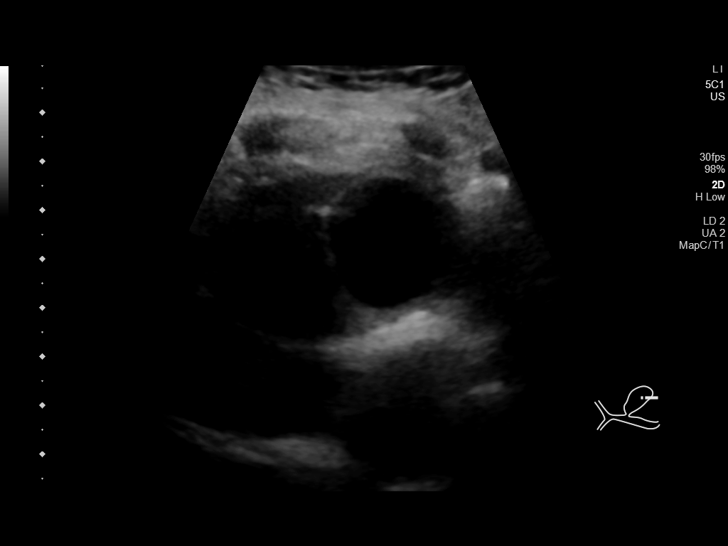
[im 14/53]
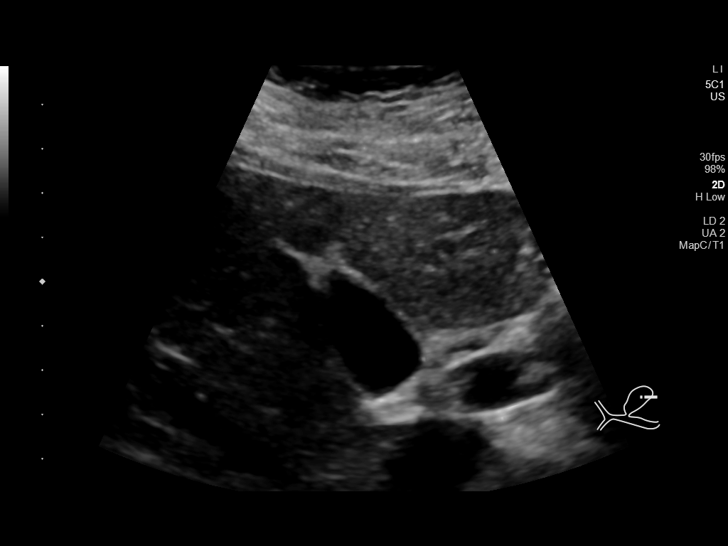
[im 18/53]
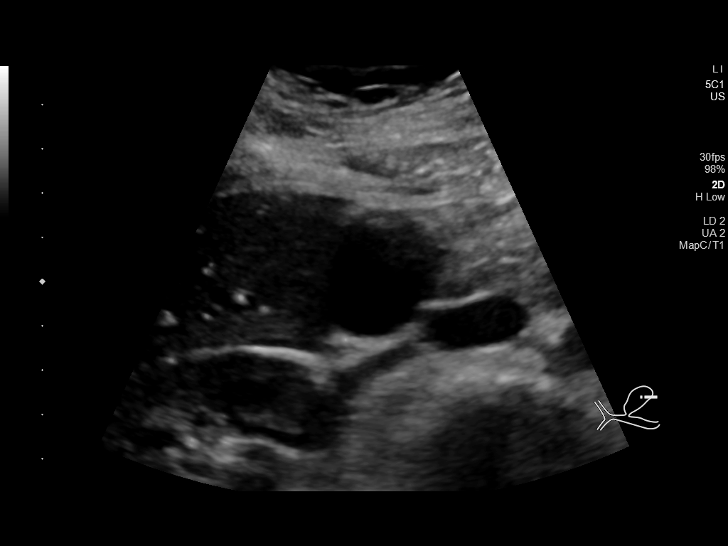
[im 20/53]
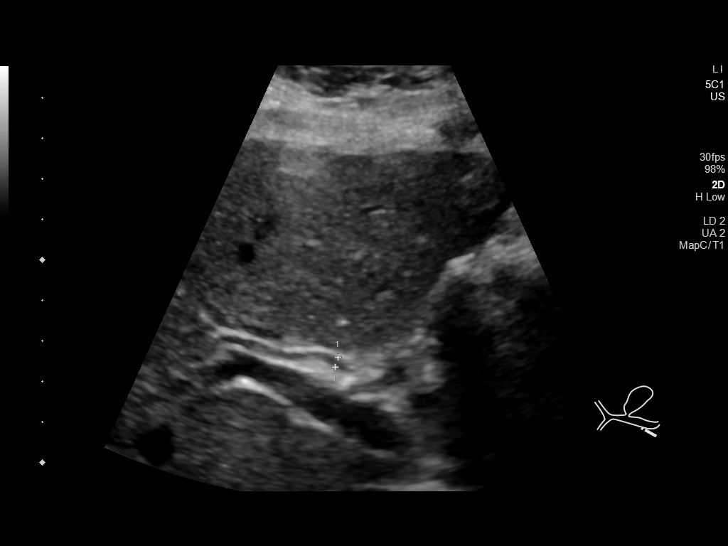
[im 24/53]
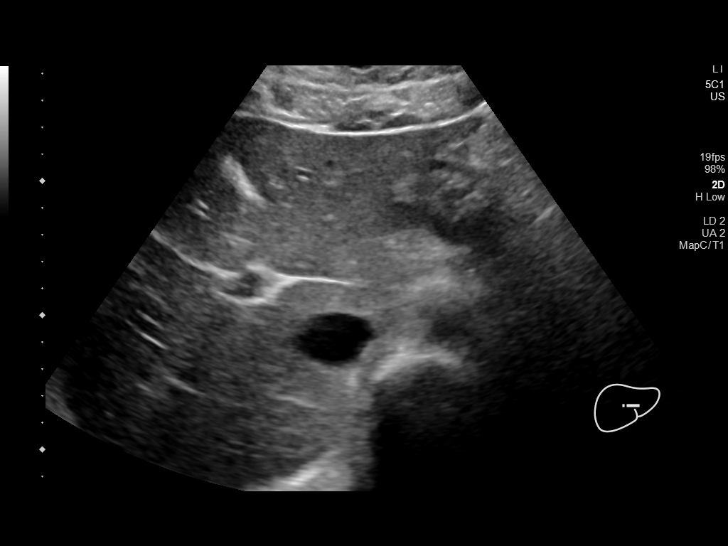
[im 29/53]
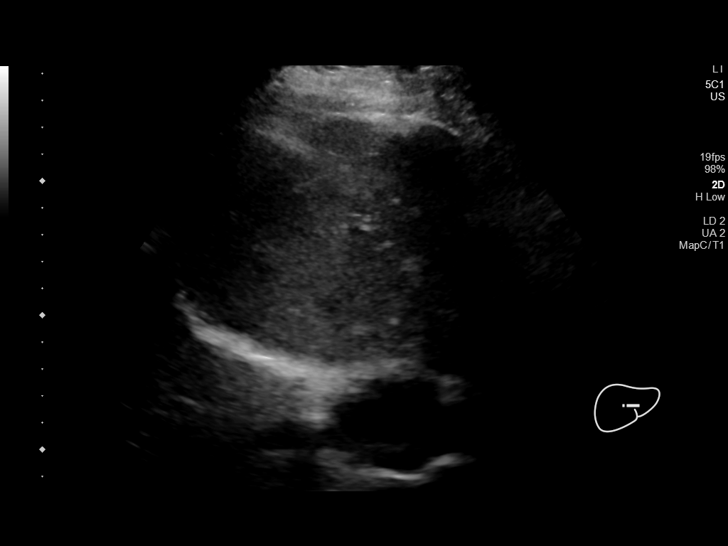
[im 33/53]
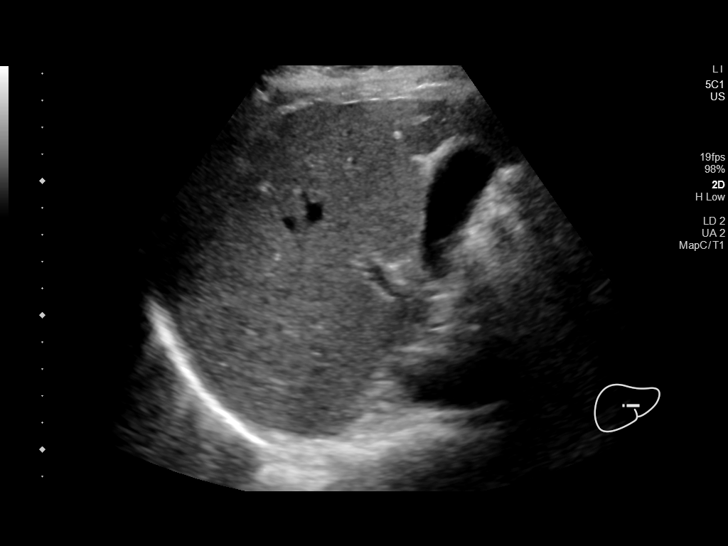
[im 35/53]
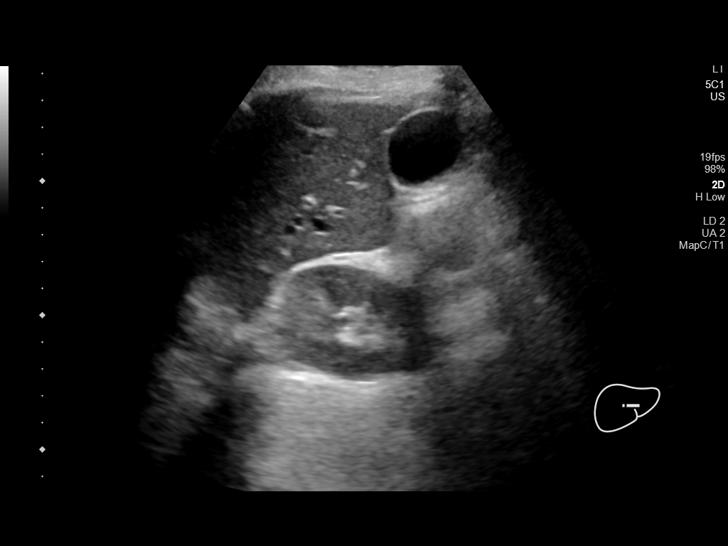
[im 40/53]
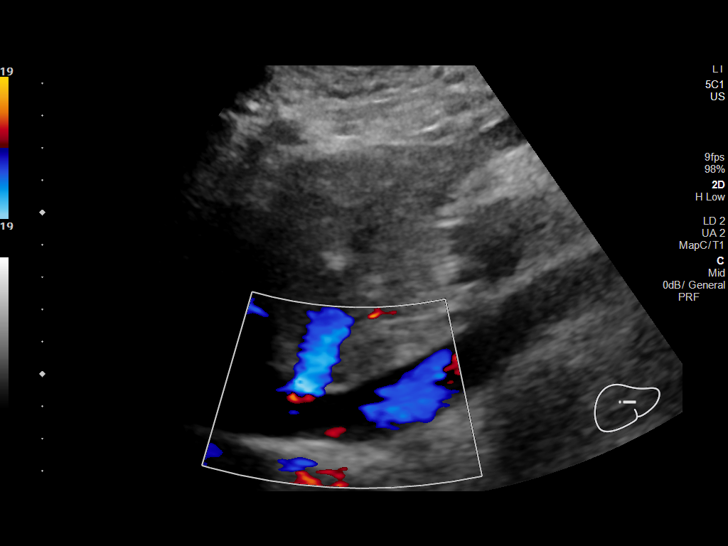
[im 44/53]
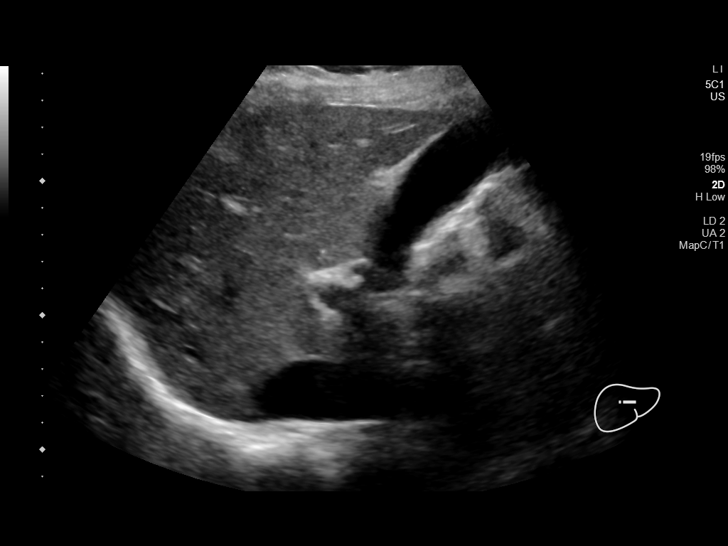
[im 48/53]
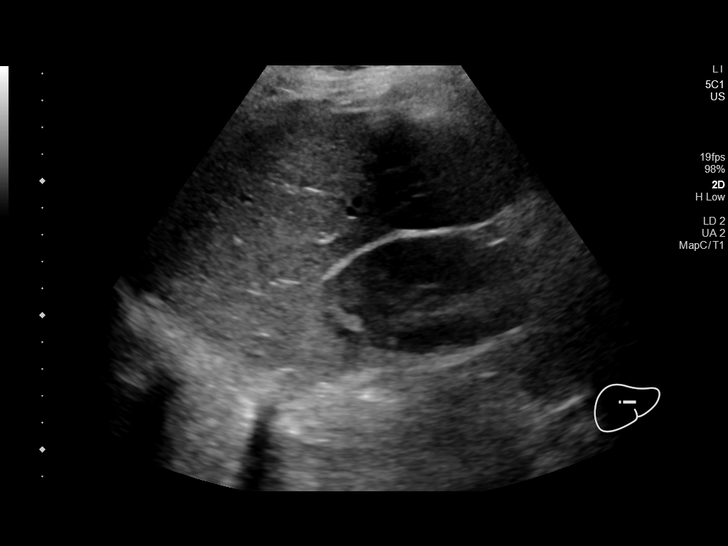
[im 53/53]
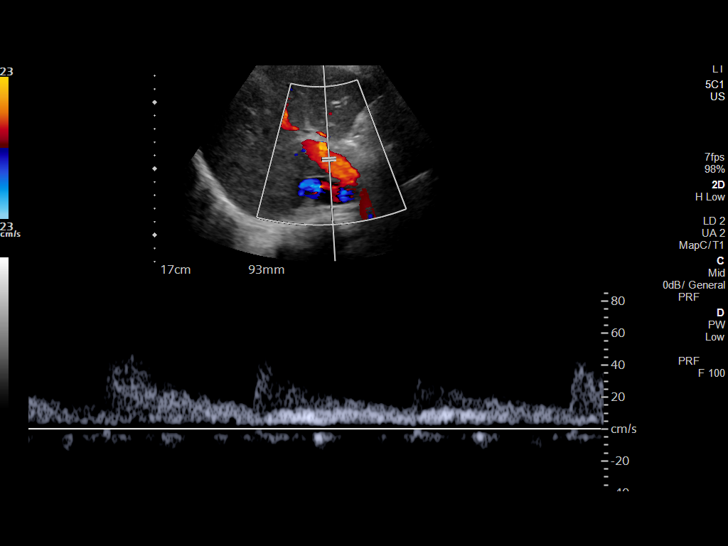

[14 of 25 positions shown; findings below may reference images not displayed]

FINDINGS: Gallbladder:

No gallstones or wall thickening visualized. No sonographic Murphy
sign noted by sonographer.

Common bile duct:

Diameter: 3 mm

Liver:

No focal lesion identified. Within normal limits in parenchymal
echogenicity. Portal vein is patent on color Doppler imaging with
normal direction of blood flow towards the liver.

Other: None.
IMPRESSION: Normal right upper quadrant ultrasound.

## 2022-06-22 ENCOUNTER — Other Ambulatory Visit (HOSPITAL_COMMUNITY)
Admission: RE | Admit: 2022-06-22 | Discharge: 2022-06-22 | Disposition: A | Discharge status: Home / Self Care | Payer: Self-pay | Source: Ambulatory Visit | Attending: Obstetrics and Gynecology | Admitting: Obstetrics and Gynecology

## 2022-06-22 ENCOUNTER — Ambulatory Visit: Payer: Self-pay | Admitting: Hematology and Oncology

## 2022-06-22 VITALS — BP 115/79 | Wt 171.0 lb

## 2022-06-22 DIAGNOSIS — R87612 Low grade squamous intraepithelial lesion on cytologic smear of cervix (LGSIL): Secondary | ICD-10-CM

## 2022-06-22 DIAGNOSIS — R87618 Other abnormal cytological findings on specimens from cervix uteri: Secondary | ICD-10-CM

## 2022-06-22 NOTE — Progress Notes (Signed)
Ms. Jowana Osterkamp is a 27 y.o. female who presents to [facility] clinic today with no complaints.    Pap Smear: Pap not smear completed today. 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 Per patient has no history of an abnormal Pap smear. Last Pap smear result is available in Epic.   Physical exam: Breasts Breasts symmetrical. No skin abnormalities bilateral breasts. No nipple retraction bilateral breasts. No nipple discharge bilateral breasts. No lymphadenopathy. No lumps palpated bilateral breasts.       Pelvic/Bimanual Pap is not indicated today  Physiological scientist   GYNECOLOGY CLINIC COLPOSCOPY PROCEDURE NOTE  Ms. Dahlyla Garibaldi is a 27 y.o. [id number] here for colposcopy for LSIL/ HPV+ pap smear on 04/23/22. Discussed role for HPV in cervical dysplasia, need for surveillance.  Patient given informed consent, signed copy in the chart, time out was performed.  Placed in lithotomy position. Cervix viewed with speculum and colposcope after application of acetic acid.   Colposcopy adequate? Yes  no visible lesions; biopsies obtained.  ECC specimen obtained. All specimens were labelled and sent to pathology.  Patient was given post procedure instructions.  Will follow up pathology and manage accordingly.  Routine preventative health maintenance measures emphasized.   Dayton Scrape A, NP 06/22/2022 1:34 PM     Smoking History: Patient has never smoked and was not referred to quit line.    Patient Navigation: Patient education provided. Access to services provided for patient through [place] interpreter provided. No transportation provided   Colorectal Cancer Screening: Per patient has never had colonoscopy completed No complaints today.    Breast and Cervical Cancer Risk Assessment: Patient does not have family history of breast cancer, known genetic mutations, or radiation treatment to the chest before age 84. Patient does not have history of  cervical dysplasia, immunocompromised, or DES exposure in-utero.  Risk Assessment   No risk assessment data     A: BCCCP exam without pap smear No complaint with benign exam. Colposcopy completed  P: Continue annual clinical breast exam.   Melodye Ped, NP 06/22/2022 1:34 PM

## 2022-06-24 LAB — SURGICAL PATHOLOGY

## 2022-06-28 ENCOUNTER — Telehealth: Payer: Self-pay

## 2022-06-28 NOTE — Telephone Encounter (Signed)
Using [employer], [place], [place]: [id number], pt has been advised of her COLPO results. Pt expressed understanding of this information. Pt understands to repeat her PAP in one year. She did not have any questions and thanked me for the call.

## 2022-06-28 NOTE — Telephone Encounter (Signed)
-----   Message from Melodye Ped, NP sent at 06/28/2022  3:08 PM EDT ----- We can repeat Pap smear in one year.   ----- Message ----- From: Interface, Lab In Three [facility] One Sent: 06/24/2022   1:53 PM EDT To: Melodye Ped, NP

## 2023-01-04 ENCOUNTER — Other Ambulatory Visit: Payer: Self-pay

## 2023-01-04 DIAGNOSIS — N644 Mastodynia: Secondary | ICD-10-CM

## 2023-03-10 ENCOUNTER — Other Ambulatory Visit: Payer: Self-pay

## 2023-03-10 ENCOUNTER — Ambulatory Visit: Payer: Self-pay

## 2023-06-08 ENCOUNTER — Other Ambulatory Visit: Payer: Self-pay | Admitting: *Deleted

## 2023-06-08 DIAGNOSIS — Z124 Encounter for screening for malignant neoplasm of cervix: Secondary | ICD-10-CM

## 2023-06-08 NOTE — Progress Notes (Signed)
 Patient: Lindsay Contreras           Date of Birth: Jan 06, 1996           MRN: [id number] Visit Date: 06/08/2023 PCP: Sandre Kitty, PA-C  Cervical Cancer Screening Do you smoke?: No Have you ever had or been told you have an allergy to latex products?: No Marital status: Married Date of last pap smear: 1-2 yrs ago Date of last menstrual period: 04/10/23 (Approximate. Pt has IUD) Number of pregnancies: 3 Number of births: 3 Have you ever had any of the following? Hysterectomy: No Tubal ligation (tubes tied): No Abnormal bleeding: No Abnormal pap smear: Yes Venereal warts: No A sex partner with venereal warts: No A high risk* sex partner: No  Cervical Exam  Abnormal Observations: Normal Exam. IUD strings visualized. Recommendations: Last Pap smear was 04/23/2022 at [facility] and LSIL with positive HPV. Patient had a colposcopy to follow up 06/22/2022 that was benign. Per patient has history of one other abnormal Pap smear 4-5 years ago that a colposcopy was completed for follow up. Last Pap smear result is available in EPIC. Let patient know if today's Pap smear is normal that next Pap smear is due in one year. Informed patient that will follow up with her within the next couple of weeks with results of her Pap smear by letter or phone. Patient verbalized understanding.    Spanish interpreter Lindsay Contreras from [facility] provided.  Patient's History Patient Active Problem List   Diagnosis Date Noted   SVD (spontaneous vaginal delivery) 05/24/2020   Active labor at term 10/23/2014   Active labor 10/23/2014   Evaluate anatomy not seen on prior sonogram    [redacted] weeks gestation of pregnancy    Encounter for fetal anatomic survey    [redacted] weeks gestation of pregnancy    No past medical history on file.  Family History  Problem Relation Age of Onset   Diabetes Mother     Social History   Occupational History   Not on file  Tobacco Use   Smoking status: Never    Smokeless tobacco: Never  Vaping Use   Vaping status: Never Used  Substance and Sexual Activity   Alcohol use: Yes   Drug use: Never   Sexual activity: Yes    Birth control/protection: I.U.D.

## 2023-06-08 NOTE — Patient Instructions (Signed)
 Marland Kitchen

## 2023-06-13 ENCOUNTER — Telehealth: Payer: Self-pay

## 2023-06-13 LAB — CYTOLOGY - PAP

## 2023-06-13 NOTE — Telephone Encounter (Signed)
 Using [place], ID: [id number], I have left a message for the pt requesting she return my call for her PAP results. I also need to confirm her preferred pharmacy for rx to treat her BV.

## 2023-06-14 ENCOUNTER — Other Ambulatory Visit: Payer: Self-pay

## 2023-06-14 DIAGNOSIS — B9689 Other specified bacterial agents as the cause of diseases classified elsewhere: Secondary | ICD-10-CM

## 2023-06-14 MED ORDER — METRONIDAZOLE 500 MG PO TABS: 500.0000 mg | ORAL_TABLET | Freq: Two times a day (BID) | ORAL | 0 refills | Status: AC

## 2023-06-14 NOTE — Telephone Encounter (Signed)
 Using [employer], Joslyn Hy, I have advised the pt of her PAP results, confirmed her preferred pharmacy, and scheduled her for a COLPO on April 8th at 10am. Pt expressed understanding of all this information.

## 2023-07-05 ENCOUNTER — Other Ambulatory Visit (HOSPITAL_COMMUNITY)
Admission: RE | Admit: 2023-07-05 | Discharge: 2023-07-05 | Disposition: A | Discharge status: Home / Self Care | Payer: Self-pay | Source: Ambulatory Visit | Attending: Obstetrics and Gynecology | Admitting: Obstetrics and Gynecology

## 2023-07-05 ENCOUNTER — Ambulatory Visit: Payer: Self-pay | Admitting: Hematology and Oncology

## 2023-07-05 ENCOUNTER — Other Ambulatory Visit: Payer: Self-pay

## 2023-07-05 VITALS — BP 95/57 | Wt 162.0 lb

## 2023-07-05 DIAGNOSIS — R87612 Low grade squamous intraepithelial lesion on cytologic smear of cervix (LGSIL): Secondary | ICD-10-CM | POA: Insufficient documentation

## 2023-07-05 DIAGNOSIS — Z01812 Encounter for preprocedural laboratory examination: Secondary | ICD-10-CM

## 2023-07-05 NOTE — Progress Notes (Signed)
 [name] is a 28 y.o. female who presents to [facility] clinic today with no complaints.    Pap Smear: Pap not smear completed today. Last Pap smear was 06/08/2023 and was abnormal - LSIL/HPV- . Per patient has history of an abnormal Pap smear. Last Pap smear result is available in Epic. 04/23/2022 - LSIL/ HPV+; 05/09/2019 - LSIL/no HPV; Colposcopy - 06/22/2022 - benign   Physical exam: Breasts Breasts symmetrical. No skin abnormalities bilateral breasts. No nipple retraction bilateral breasts. No nipple discharge bilateral breasts. No lymphadenopathy. No lumps palpated bilateral breasts.       Pelvic/Bimanual Pap is not indicated today   GYNECOLOGY CLINIC COLPOSCOPY PROCEDURE NOTE  [name] is a 28 y.o. [id number] here for colposcopy for LSIL/ HPV- pap smear on 06/08/2023. Discussed role for HPV in cervical dysplasia, need for surveillance.  Patient given informed consent, signed copy in the chart, time out was performed.  Placed in lithotomy position. Cervix viewed with speculum and colposcope after application of acetic acid.   Colposcopy adequate? Yes  acetowhite lesion(s) noted at 1 o'clock; biopsies obtained.  ECC specimen obtained. All specimens were labelled and sent to pathology.  Patient was given post procedure instructions.  Will follow up pathology and manage accordingly.  Routine preventative health maintenance measures emphasized.    Smoking History: Patient has never smoked and was not referred to quit line.    Patient Navigation: Patient education provided. Access to services provided for patient through BCCCP program. [name] interpreter provided. No transportation provided   Colorectal Cancer Screening: Per patient has never had colonoscopy completed No complaints today.    Breast and Cervical Cancer Risk Assessment: Patient does not have family history of breast cancer, known genetic mutations, or radiation treatment to the chest before age 42.  Patient does not have history of cervical dysplasia, immunocompromised, or DES exposure in-utero.  Risk Assessment   No risk assessment data     A: BCCCP exam without pap smear No complaints with benign exam. Colposcopy as above.   P: Will await pathology. 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   Pascal Lux, NP 07/05/2023 10:42 AM

## 2023-07-07 ENCOUNTER — Other Ambulatory Visit: Payer: Self-pay

## 2023-07-07 ENCOUNTER — Telehealth: Payer: Self-pay

## 2023-07-07 DIAGNOSIS — R87612 Low grade squamous intraepithelial lesion on cytologic smear of cervix (LGSIL): Secondary | ICD-10-CM

## 2023-07-07 LAB — SURGICAL PATHOLOGY

## 2023-07-07 NOTE — Telephone Encounter (Signed)
 Using [facility] interpreter, Joslyn Hy, I have advised the pt of her COLPO results. 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 All questions were answered and pt voiced understanding of the information provided.
